# Patient Record
Sex: Female | Born: 1950
Health system: Southern US, Community
[De-identification: ages and names within clinical notes are randomized; demographics above are authoritative.]

## PROBLEM LIST (undated history)

## (undated) ENCOUNTER — Emergency Department (HOSPITAL_COMMUNITY): Admission: EM | Payer: BC Managed Care – PPO | Source: Home / Self Care

## (undated) DIAGNOSIS — E7800 Pure hypercholesterolemia, unspecified: Secondary | ICD-10-CM

## (undated) DIAGNOSIS — K219 Gastro-esophageal reflux disease without esophagitis: Secondary | ICD-10-CM

## (undated) DIAGNOSIS — D649 Anemia, unspecified: Secondary | ICD-10-CM

## (undated) DIAGNOSIS — K222 Esophageal obstruction: Secondary | ICD-10-CM

## (undated) DIAGNOSIS — K5790 Diverticulosis of intestine, part unspecified, without perforation or abscess without bleeding: Secondary | ICD-10-CM

## (undated) DIAGNOSIS — C801 Malignant (primary) neoplasm, unspecified: Secondary | ICD-10-CM

## (undated) DIAGNOSIS — F419 Anxiety disorder, unspecified: Secondary | ICD-10-CM

## (undated) DIAGNOSIS — K5909 Other constipation: Secondary | ICD-10-CM

## (undated) HISTORY — PX: MOHS SURGERY: SUR867

## (undated) HISTORY — PX: BREAST EXCISIONAL BIOPSY: SUR124

## (undated) HISTORY — PX: COLONOSCOPY: SHX174

## (undated) HISTORY — PX: BACK SURGERY: SHX140

---

## 1998-08-01 ENCOUNTER — Emergency Department (HOSPITAL_COMMUNITY): Admission: EM | Admit: 1998-08-01 | Discharge: 1998-08-01 | Payer: Self-pay | Admitting: Emergency Medicine

## 2000-10-12 ENCOUNTER — Encounter: Payer: Self-pay | Admitting: Emergency Medicine

## 2000-10-12 ENCOUNTER — Emergency Department (HOSPITAL_COMMUNITY): Admission: EM | Admit: 2000-10-12 | Discharge: 2000-10-12 | Payer: Self-pay | Admitting: Emergency Medicine

## 2002-05-29 ENCOUNTER — Encounter: Admission: RE | Admit: 2002-05-29 | Discharge: 2002-05-29 | Payer: Self-pay | Admitting: Neurosurgery

## 2002-05-29 ENCOUNTER — Encounter: Payer: Self-pay | Admitting: Neurosurgery

## 2002-08-01 ENCOUNTER — Inpatient Hospital Stay (HOSPITAL_COMMUNITY): Admission: RE | Admit: 2002-08-01 | Discharge: 2002-08-07 | Payer: Self-pay | Admitting: Neurosurgery

## 2002-08-01 ENCOUNTER — Encounter: Payer: Self-pay | Admitting: Neurosurgery

## 2002-09-28 ENCOUNTER — Encounter: Admission: RE | Admit: 2002-09-28 | Discharge: 2002-11-08 | Payer: Self-pay | Admitting: Neurosurgery

## 2002-11-09 ENCOUNTER — Encounter: Admission: RE | Admit: 2002-11-09 | Discharge: 2002-12-20 | Payer: Self-pay | Admitting: Neurosurgery

## 2003-06-27 ENCOUNTER — Encounter: Admission: RE | Admit: 2003-06-27 | Discharge: 2003-09-25 | Payer: Self-pay | Admitting: Neurosurgery

## 2014-01-27 ENCOUNTER — Encounter (HOSPITAL_COMMUNITY): Payer: Self-pay | Admitting: Emergency Medicine

## 2014-01-27 ENCOUNTER — Emergency Department (HOSPITAL_COMMUNITY): Payer: BC Managed Care – PPO

## 2014-01-27 ENCOUNTER — Emergency Department (HOSPITAL_COMMUNITY)
Admission: EM | Admit: 2014-01-27 | Discharge: 2014-01-27 | Disposition: A | Discharge status: Home / Self Care | Payer: BC Managed Care – PPO | Attending: Emergency Medicine | Admitting: Emergency Medicine

## 2014-01-27 DIAGNOSIS — R58 Hemorrhage, not elsewhere classified: Secondary | ICD-10-CM

## 2014-01-27 DIAGNOSIS — N939 Abnormal uterine and vaginal bleeding, unspecified: Secondary | ICD-10-CM | POA: Insufficient documentation

## 2014-01-27 DIAGNOSIS — N926 Irregular menstruation, unspecified: Secondary | ICD-10-CM | POA: Insufficient documentation

## 2014-01-27 DIAGNOSIS — K644 Residual hemorrhoidal skin tags: Secondary | ICD-10-CM | POA: Insufficient documentation

## 2014-01-27 LAB — POC OCCULT BLOOD, ED: Fecal Occult Bld: NEGATIVE

## 2014-01-27 LAB — URINALYSIS, ROUTINE W REFLEX MICROSCOPIC
Bilirubin Urine: NEGATIVE
GLUCOSE, UA: NEGATIVE mg/dL
HGB URINE DIPSTICK: NEGATIVE
KETONES UR: 15 mg/dL — AB
Leukocytes, UA: NEGATIVE
Nitrite: NEGATIVE
Protein, ur: NEGATIVE mg/dL
Specific Gravity, Urine: 1.022 (ref 1.005–1.030)
UROBILINOGEN UA: 0.2 mg/dL (ref 0.0–1.0)
pH: 7 (ref 5.0–8.0)

## 2014-01-27 LAB — BASIC METABOLIC PANEL
BUN: 12 mg/dL (ref 6–23)
CALCIUM: 9.9 mg/dL (ref 8.4–10.5)
CO2: 24 mEq/L (ref 19–32)
Chloride: 98 mEq/L (ref 96–112)
Creatinine, Ser: 0.77 mg/dL (ref 0.50–1.10)
GFR calc non Af Amer: 88 mL/min — ABNORMAL LOW (ref >90)
GLUCOSE: 126 mg/dL — AB (ref 70–99)
Potassium: 4.4 mEq/L (ref 3.7–5.3)
SODIUM: 138 meq/L (ref 137–147)

## 2014-01-27 LAB — CBC WITH DIFFERENTIAL/PLATELET
Basophils Absolute: 0 10*3/uL (ref 0.0–0.1)
Basophils Relative: 1 % (ref 0–1)
Eosinophils Absolute: 0.1 10*3/uL (ref 0.0–0.7)
Eosinophils Relative: 2 % (ref 0–5)
HCT: 40.9 % (ref 36.0–46.0)
Hemoglobin: 14.1 g/dL (ref 12.0–15.0)
Lymphocytes Relative: 20 % (ref 12–46)
Lymphs Abs: 1.3 10*3/uL (ref 0.7–4.0)
MCH: 31.4 pg (ref 26.0–34.0)
MCHC: 34.5 g/dL (ref 30.0–36.0)
MCV: 91.1 fL (ref 78.0–100.0)
Monocytes Absolute: 0.8 10*3/uL (ref 0.1–1.0)
Monocytes Relative: 12 % (ref 3–12)
Neutro Abs: 4.3 10*3/uL (ref 1.7–7.7)
Neutrophils Relative %: 65 % (ref 43–77)
Platelets: 305 10*3/uL (ref 150–400)
RBC: 4.49 MIL/uL (ref 3.87–5.11)
RDW: 13.7 % (ref 11.5–15.5)
WBC: 6.6 10*3/uL (ref 4.0–10.5)

## 2014-01-27 LAB — WET PREP, GENITAL
Clue Cells Wet Prep HPF POC: NONE SEEN
Trich, Wet Prep: NONE SEEN
WBC WET PREP: NONE SEEN
Yeast Wet Prep HPF POC: NONE SEEN

## 2014-01-27 NOTE — ED Provider Notes (Signed)
CSN: [id number]     Arrival date & time 01/27/14  1321 History   First MD Initiated Contact with Patient 01/27/14 1332     Chief Complaint  Patient presents with  . Vaginal Bleeding     (Consider location/radiation/quality/duration/timing/severity/associated sxs/prior Treatment) HPI Patient is G2 P2 Ab0. She reports she had no significant female problems while she was menstruating. She states she went through menopause about 10 years ago. She did not have hormonal replacement therapy. She states she was placed on Effexor which helped greatly with her hot flashes. She states this morning when she went to the bathroom she noted she had blood in the crotch of her underwear. She thinks it is from her vagina. She does not have any abdominal pain, urged to have a BM, nausea or vomiting. She has not had a BM today. She states when she went to the bathroom to urinate there was no obvious blood in the toilet.  Patient reports her mother had endometriosis and she has a sister who had a hysterectomy. There is also family history of fibroids. She is not aware of any GYN cancers in the family.  PCP Dr America Brown GYN Dr Ree Edman  History reviewed. No pertinent past medical history. History reviewed. No pertinent past surgical history. History reviewed. No pertinent family history. History  Substance Use Topics  . Smoking status: Never Smoker   . Smokeless tobacco: Not on file  . Alcohol Use: Yes   Stay at home mother  OB History   Grav Para Term Preterm Abortions TAB SAB Ect Mult Living                 Review of Systems  All other systems reviewed and are negative.     Allergies  Ciprofloxacin and Doxycycline  Home Medications   Current Outpatient Rx  Name  Route  Sig  Dispense  Refill  . Cholecalciferol 1000 UNITS capsule   Oral   Take 1,000 Units by mouth every evening.         . Coenzyme Q10 (COQ10) 200 MG CAPS   Oral   Take 200 mg by mouth every evening.         Marland Kitchen EFFEXOR  XR 75 MG 24 hr capsule               . Multiple Vitamin (MULTIVITAMIN WITH MINERALS) TABS tablet   Oral   Take 1 tablet by mouth every evening.         . Multiple Vitamins-Minerals (OCUVITE ADULT 50+ PO)   Oral   Take 1 tablet by mouth every evening.         Vladimir Faster Glycol-Propyl Glycol (SYSTANE OP)   Ophthalmic   Apply 1 drop to eye every morning.         . Pseudoephedrine-Ibuprofen 30-200 MG TABS   Oral   Take 1 tablet by mouth once.          BP 169/98  Pulse 107  Temp(Src) 98.7 F (37.1 C) (Oral)  Resp 22  Ht 5' (1.524 m)  Wt 143 lb (64.864 kg)  BMI 27.93 kg/m2  SpO2 98%  Vital signs normal except tachycardia  Orthostatic vital signs are negative  Physical Exam  Nursing note and vitals reviewed. Constitutional: She is oriented to person, place, and time. She appears well-developed and well-nourished.  Non-toxic appearance. She does not appear ill. No distress.  HENT:  Head: Normocephalic and atraumatic.  Right Ear: External ear normal.  Left  Ear: External ear normal.  Nose: Nose normal. No mucosal edema or rhinorrhea.  Mouth/Throat: Oropharynx is clear and moist and mucous membranes are normal. No dental abscesses or uvula swelling.  Eyes: Conjunctivae and EOM are normal. Pupils are equal, round, and reactive to light.  Neck: Normal range of motion and full passive range of motion without pain. Neck supple.  Cardiovascular: Normal rate, regular rhythm and normal heart sounds.  Exam reveals no gallop and no friction rub.   No murmur heard. Pulmonary/Chest: Effort normal and breath sounds normal. No respiratory distress. She has no wheezes. She has no rhonchi. She has no rales. She exhibits no tenderness and no crepitus.  Abdominal: Soft. Normal appearance and bowel sounds are normal. She exhibits no distension. There is no tenderness. There is no rebound and no guarding.  Genitourinary:  Visualization of her groin shows no obvious bleeding. Patient  will have pelvic exam done  No blood was seen in her vaginal vault. She does have some mild erythema scattered around the cervix however there is no active bleeding. Patient has not had recent sexual contact. Her uterus feels normal size or smaller than normal without tenderness. Her adnexal region are nontender bilaterally. There's no obvious masses felt.  Rectal exam shows some hemorrhoidal tags. There are no active hemorrhoids. There is no bleeding seen in the paramedian. On rectal exam there were small amount of yellow stool on glove that was sent for testing.  Musculoskeletal: Normal range of motion. She exhibits no edema and no tenderness.  Moves all extremities well.   Neurological: She is alert and oriented to person, place, and time. She has normal strength. No cranial nerve deficit.  Skin: Skin is warm, dry and intact. No rash noted. No erythema. No pallor.  Psychiatric: She has a normal mood and affect. Her speech is normal and behavior is normal. Her mood appears not anxious.    ED Course  Procedures (including critical care time)  Pt is agreeable to getting a pelvic US tonight.   Patient turned over to Dr. Darl Householder at change of shift to get her pelvic ultrasound results. At this time the source of her bleeding is undetermined. There is no blood in her vaginal vault, her urine was not grossly bloody, there was no blood in her rectal exam. She does have some hemorrhoids but there was no active bleeding seen.  Labs Review Results for orders placed during the hospital encounter of 01/27/14  WET PREP, GENITAL      Result Value Ref Range   Yeast Wet Prep HPF POC NONE SEEN  NONE SEEN   Trich, Wet Prep NONE SEEN  NONE SEEN   Clue Cells Wet Prep HPF POC NONE SEEN  NONE SEEN   WBC, Wet Prep HPF POC NONE SEEN  NONE SEEN  CBC WITH DIFFERENTIAL      Result Value Ref Range   WBC 6.6  4.0 - 10.5 K/uL   RBC 4.49  3.87 - 5.11 MIL/uL   Hemoglobin 14.1  12.0 - 15.0 g/dL   HCT 40.9  36.0 - 46.0 %    MCV 91.1  78.0 - 100.0 fL   MCH 31.4  26.0 - 34.0 pg   MCHC 34.5  30.0 - 36.0 g/dL   RDW 13.7  11.5 - 15.5 %   Platelets 305  150 - 400 K/uL   Neutrophils Relative % 65  43 - 77 %   Neutro Abs 4.3  1.7 - 7.7 K/uL   Lymphocytes  Relative 20  12 - 46 %   Lymphs Abs 1.3  0.7 - 4.0 K/uL   Monocytes Relative 12  3 - 12 %   Monocytes Absolute 0.8  0.1 - 1.0 K/uL   Eosinophils Relative 2  0 - 5 %   Eosinophils Absolute 0.1  0.0 - 0.7 K/uL   Basophils Relative 1  0 - 1 %   Basophils Absolute 0.0  0.0 - 0.1 K/uL  BASIC METABOLIC PANEL      Result Value Ref Range   Sodium 138  137 - 147 mEq/L   Potassium 4.4  3.7 - 5.3 mEq/L   Chloride 98  96 - 112 mEq/L   CO2 24  19 - 32 mEq/L   Glucose, Bld 126 (*) 70 - 99 mg/dL   BUN 12  6 - 23 mg/dL   Creatinine, Ser 0.77  0.50 - 1.10 mg/dL   Calcium 9.9  8.4 - 10.5 mg/dL   GFR calc non Af Amer 88 (*) >90 mL/min   GFR calc Af Amer >90  >90 mL/min  URINALYSIS, ROUTINE W REFLEX MICROSCOPIC      Result Value Ref Range   Color, Urine YELLOW  YELLOW   APPearance CLEAR  CLEAR   Specific Gravity, Urine 1.022  1.005 - 1.030   pH 7.0  5.0 - 8.0   Glucose, UA NEGATIVE  NEGATIVE mg/dL   Hgb urine dipstick NEGATIVE  NEGATIVE   Bilirubin Urine NEGATIVE  NEGATIVE   Ketones, ur 15 (*) NEGATIVE mg/dL   Protein, ur NEGATIVE  NEGATIVE mg/dL   Urobilinogen, UA 0.2  0.0 - 1.0 mg/dL   Nitrite NEGATIVE  NEGATIVE   Leukocytes, UA NEGATIVE  NEGATIVE  POC OCCULT BLOOD, ED      Result Value Ref Range   Fecal Occult Bld NEGATIVE  NEGATIVE   Laboratory interpretation all normal    Imaging Review No results found.   EKG Interpretation None      MDM   Final diagnoses:  Bleeding    Disposition pending  Rolland Porter, MD, Abram Sander     Janice Norrie, MD 01/27/14 [phone]

## 2014-01-27 NOTE — ED Provider Notes (Signed)
Physical Exam  BP 147/84  Pulse 84  Temp(Src) 98.7 F (37.1 C) (Oral)  Resp 22  Ht 5' (1.524 m)  Wt 143 lb (64.864 kg)  BMI 27.93 kg/m2  SpO2 97%  Physical Exam  ED Course  Procedures  Care assumed at sign out from Dr. Tomi Bamberger. Patient has post menopausal bleeding. Pending [place]. US showed fibroids and nl endometrial stripe. She has GYN f/u. I told her that she is likely still having some bleeding. Should see GYN for further workup.   Results for orders placed during the hospital encounter of 01/27/14  WET PREP, GENITAL      Result Value Ref Range   Yeast Wet Prep HPF POC NONE SEEN  NONE SEEN   Trich, Wet Prep NONE SEEN  NONE SEEN   Clue Cells Wet Prep HPF POC NONE SEEN  NONE SEEN   WBC, Wet Prep HPF POC NONE SEEN  NONE SEEN  CBC WITH DIFFERENTIAL      Result Value Ref Range   WBC 6.6  4.0 - 10.5 K/uL   RBC 4.49  3.87 - 5.11 MIL/uL   Hemoglobin 14.1  12.0 - 15.0 g/dL   HCT 40.9  36.0 - 46.0 %   MCV 91.1  78.0 - 100.0 fL   MCH 31.4  26.0 - 34.0 pg   MCHC 34.5  30.0 - 36.0 g/dL   RDW 13.7  11.5 - 15.5 %   Platelets 305  150 - 400 K/uL   Neutrophils Relative % 65  43 - 77 %   Neutro Abs 4.3  1.7 - 7.7 K/uL   Lymphocytes Relative 20  12 - 46 %   Lymphs Abs 1.3  0.7 - 4.0 K/uL   Monocytes Relative 12  3 - 12 %   Monocytes Absolute 0.8  0.1 - 1.0 K/uL   Eosinophils Relative 2  0 - 5 %   Eosinophils Absolute 0.1  0.0 - 0.7 K/uL   Basophils Relative 1  0 - 1 %   Basophils Absolute 0.0  0.0 - 0.1 K/uL  BASIC METABOLIC PANEL      Result Value Ref Range   Sodium 138  137 - 147 mEq/L   Potassium 4.4  3.7 - 5.3 mEq/L   Chloride 98  96 - 112 mEq/L   CO2 24  19 - 32 mEq/L   Glucose, Bld 126 (*) 70 - 99 mg/dL   BUN 12  6 - 23 mg/dL   Creatinine, Ser 0.77  0.50 - 1.10 mg/dL   Calcium 9.9  8.4 - 10.5 mg/dL   GFR calc non Af Amer 88 (*) >90 mL/min   GFR calc Af Amer >90  >90 mL/min  URINALYSIS, ROUTINE W REFLEX MICROSCOPIC      Result Value Ref Range   Color, Urine YELLOW   YELLOW   APPearance CLEAR  CLEAR   Specific Gravity, Urine 1.022  1.005 - 1.030   pH 7.0  5.0 - 8.0   Glucose, UA NEGATIVE  NEGATIVE mg/dL   Hgb urine dipstick NEGATIVE  NEGATIVE   Bilirubin Urine NEGATIVE  NEGATIVE   Ketones, ur 15 (*) NEGATIVE mg/dL   Protein, ur NEGATIVE  NEGATIVE mg/dL   Urobilinogen, UA 0.2  0.0 - 1.0 mg/dL   Nitrite NEGATIVE  NEGATIVE   Leukocytes, UA NEGATIVE  NEGATIVE  POC OCCULT BLOOD, ED      Result Value Ref Range   Fecal Occult Bld NEGATIVE  NEGATIVE   US Transvaginal Non-ob  01/27/2014   CLINICAL DATA:  Postmenopausal bleeding  EXAM: TRANSABDOMINAL AND TRANSVAGINAL ULTRASOUND OF PELVIS  TECHNIQUE: Both transabdominal and transvaginal ultrasound examinations of the pelvis were performed. Transabdominal technique was performed for global imaging of the pelvis including uterus, ovaries, adnexal regions, and pelvic cul-de-sac. It was necessary to proceed with endovaginal exam following the transabdominal exam to visualize the endometrium.  COMPARISON:  None  FINDINGS: Uterus  Measurements: 5.7 x 4.2 x 3.1 cm. There are multiple fibroids present. Two lie within the fundus superiorly and one lies posteriorly. The posterior fibroid is largest measuring 1.9 cm in greatest dimension. It is pedunculated.  Endometrium  Thickness: 2 mm.  There is fluid within the endometrial cavity.  Right ovary  Measurements: 1.6 x 1.0 x 1.1 cm. Normal appearance/no adnexal mass.  Left ovary  The left ovary could not be demonstrated.  Other findings  There is no free fluid within the cul de sac. The are echogenic foci within the cervix which may reflect tiny calcifications.  IMPRESSION: 1. The endometrial stripe measures 2 mm in thickness and there is fluid in the endometrial cavity. In the setting of post-menopausal bleeding, this is consistent with a benign etiology such as endometrial atrophy. If bleeding remains unresponsive to hormonal or medical therapy, sonohysterogram should be considered  for focal lesion work-up. (Ref: Radiological Reasoning: Algorithmic Workup of Abnormal Vaginal Bleeding with Endovaginal Sonography and Sonohysterography. AJR 2008; 818:E99-37) 2. There are multiple uterine fibroids present. 3. The left ovary could not be demonstrated. The right ovary is normal in appearance.   Electronically Signed   By: Rosamund Nyland  [place]   On: 01/27/2014 16:26   US Pelvis Complete  01/27/2014   CLINICAL DATA:  Postmenopausal bleeding  EXAM: TRANSABDOMINAL AND TRANSVAGINAL ULTRASOUND OF PELVIS  TECHNIQUE: Both transabdominal and transvaginal ultrasound examinations of the pelvis were performed. Transabdominal technique was performed for global imaging of the pelvis including uterus, ovaries, adnexal regions, and pelvic cul-de-sac. It was necessary to proceed with endovaginal exam following the transabdominal exam to visualize the endometrium.  COMPARISON:  None  FINDINGS: Uterus  Measurements: 5.7 x 4.2 x 3.1 cm. There are multiple fibroids present. Two lie within the fundus superiorly and one lies posteriorly. The posterior fibroid is largest measuring 1.9 cm in greatest dimension. It is pedunculated.  Endometrium  Thickness: 2 mm.  There is fluid within the endometrial cavity.  Right ovary  Measurements: 1.6 x 1.0 x 1.1 cm. Normal appearance/no adnexal mass.  Left ovary  The left ovary could not be demonstrated.  Other findings  There is no free fluid within the cul de sac. The are echogenic foci within the cervix which may reflect tiny calcifications.  IMPRESSION: 1. The endometrial stripe measures 2 mm in thickness and there is fluid in the endometrial cavity. In the setting of post-menopausal bleeding, this is consistent with a benign etiology such as endometrial atrophy. If bleeding remains unresponsive to hormonal or medical therapy, sonohysterogram should be considered for focal lesion work-up. (Ref: Radiological Reasoning: Algorithmic Workup of Abnormal Vaginal Bleeding with Endovaginal  Sonography and Sonohysterography. AJR 2008; 169:C78-93) 2. There are multiple uterine fibroids present. 3. The left ovary could not be demonstrated. The right ovary is normal in appearance.   Electronically Signed   By: Quirino Kakos  [place]   On: 01/27/2014 16:26        Wandra Arthurs, MD 01/27/14 1725

## 2014-01-27 NOTE — ED Notes (Signed)
Pt reports waking up this am and noticed moderate vaginal bleeding. Reports that she hasnt had a period in over 10 years. Denies any pain.

## 2014-01-27 NOTE — ED Notes (Addendum)
Pt c/o vaginal bleeding starting around noon today when she woke up.  Pt states it was a moderate amount, soaking through underwear to her pants.  Pt states when she found it at noon, it was dry and bleeding probably occurred overnight.  Pt reports not further bleeding has occurred.  Denies pain or cramping.  Reports some lightheadedness. Denies hx of any GYN problems. States she went through menopause approximately 10 years ago.  Pt states she's had a cold for the past few days but not concerned about those symptoms.  No acute distress, respirations equal and unlabored, skin warm and dry.

## 2014-01-27 NOTE — Discharge Instructions (Signed)
See your GYN doctor.   You may still have some bleeding.   Return to ER if you have severe bleeding, worse pain, passing out.

## 2014-01-29 LAB — GC/CHLAMYDIA PROBE AMP
CT PROBE, AMP APTIMA: NEGATIVE
GC Probe RNA: NEGATIVE

## 2015-07-24 ENCOUNTER — Other Ambulatory Visit: Payer: Self-pay | Admitting: Internal Medicine

## 2015-07-24 DIAGNOSIS — Z1231 Encounter for screening mammogram for malignant neoplasm of breast: Secondary | ICD-10-CM

## 2015-08-24 ENCOUNTER — Emergency Department (HOSPITAL_COMMUNITY)
Admission: EM | Admit: 2015-08-24 | Discharge: 2015-08-24 | Disposition: A | Discharge status: Home / Self Care | Payer: BLUE CROSS/BLUE SHIELD | Attending: Emergency Medicine | Admitting: Emergency Medicine

## 2015-08-24 ENCOUNTER — Encounter (HOSPITAL_COMMUNITY): Payer: Self-pay

## 2015-08-24 DIAGNOSIS — Y9389 Activity, other specified: Secondary | ICD-10-CM | POA: Diagnosis not present

## 2015-08-24 DIAGNOSIS — J3489 Other specified disorders of nose and nasal sinuses: Secondary | ICD-10-CM | POA: Diagnosis not present

## 2015-08-24 DIAGNOSIS — Y998 Other external cause status: Secondary | ICD-10-CM | POA: Insufficient documentation

## 2015-08-24 DIAGNOSIS — Z79899 Other long term (current) drug therapy: Secondary | ICD-10-CM | POA: Diagnosis not present

## 2015-08-24 DIAGNOSIS — W57XXXA Bitten or stung by nonvenomous insect and other nonvenomous arthropods, initial encounter: Secondary | ICD-10-CM | POA: Diagnosis not present

## 2015-08-24 DIAGNOSIS — Y9289 Other specified places as the place of occurrence of the external cause: Secondary | ICD-10-CM | POA: Insufficient documentation

## 2015-08-24 DIAGNOSIS — S30861A Insect bite (nonvenomous) of abdominal wall, initial encounter: Secondary | ICD-10-CM | POA: Insufficient documentation

## 2015-08-24 NOTE — ED Provider Notes (Signed)
History  By signing my name below, I, Marlowe Kays, attest that this documentation has been prepared under the direction and in the presence of Bernerd Limbo, [place]. Electronically Signed: Marlowe Kays, ED Scribe. 08/24/2015. 1:59 PM  Chief Complaint  Patient presents with  . Tick Removal    The history is provided by the patient and medical records. No language interpreter was used.     HPI Comments:  Claudia Robinson is a 64 y.o. female with no pertinent PMH who presents to the Emergency Department for tick removal. She states she has had a lesion to her abdomen x 3-4 days, which she initially though was a skin tag, until today, when she realized it was a tick. She reports she recently started taking augmentin on 10/27 for a sinus infection and developed a rash on her torso 3-4 days later. She contacted her PCP and was switched to azithromycin, which she states she is currently taking as directed. She denies exacerbating or alleviating factors. She denies fever, chills, headache, lightheadedness, dizziness, vision changes, arthralgia, myalgia, chest pain, shortness of breath, abdominal pain, N/V/D.     History reviewed. No pertinent past medical history. No past surgical history on file. No family history on file. Social History  Substance Use Topics  . Smoking status: Never Smoker   . Smokeless tobacco: None  . Alcohol Use: Yes   OB History    No data available       Review of Systems  Constitutional: Negative for fever, chills and fatigue.  HENT: Positive for sinus pressure.   Eyes: Negative for visual disturbance.  Respiratory: Negative for shortness of breath.   Cardiovascular: Negative for chest pain.  Gastrointestinal: Negative for nausea, vomiting, abdominal pain and diarrhea.  Musculoskeletal: Negative for myalgias, arthralgias, neck pain and neck stiffness.  Skin: Positive for rash and wound.  Neurological: Negative for dizziness, syncope, weakness,  light-headedness, numbness and headaches.  All other systems reviewed and are negative.   Allergies  Ciprofloxacin and Doxycycline  Home Medications   Prior to Admission medications   Medication Sig Start Date End Date Taking? Authorizing Provider  Cholecalciferol 1000 UNITS capsule Take 1,000 Units by mouth every evening.    Historical Provider, MD  Coenzyme Q10 (COQ10) 200 MG CAPS Take 200 mg by mouth every evening.    Historical Provider, MD  EFFEXOR XR 75 MG 24 hr capsule  01/16/14   Historical Provider, MD  famotidine (PEPCID) 20 MG tablet Take 20 mg by mouth 2 (two) times daily.    Historical Provider, MD  Multiple Vitamin (MULTIVITAMIN WITH MINERALS) TABS tablet Take 1 tablet by mouth every evening.    Historical Provider, MD  Multiple Vitamins-Minerals (OCUVITE ADULT 50+ PO) Take 1 tablet by mouth every evening.    Historical Provider, MD  Polyethyl Glycol-Propyl Glycol (SYSTANE OP) Apply 1 drop to eye every morning.    Historical Provider, MD  Pseudoephedrine-Ibuprofen 30-200 MG TABS Take 1 tablet by mouth once.    Historical Provider, MD    Triage Vitals: BP 112/77 mmHg  Pulse 76  Temp(Src) 97.9 F (36.6 C) (Oral)  Resp 16  SpO2 100% Physical Exam  Constitutional: She is oriented to person, place, and time. She appears well-developed and well-nourished. No distress.  HENT:  Head: Normocephalic and atraumatic.  Right Ear: External ear normal.  Left Ear: External ear normal.  Nose: Nose normal.  Mouth/Throat: Uvula is midline, oropharynx is clear and moist and mucous membranes are normal.  Eyes: Conjunctivae, EOM  and lids are normal. Pupils are equal, round, and reactive to light. Right eye exhibits no discharge. Left eye exhibits no discharge. No scleral icterus.  Neck: Normal range of motion. Neck supple.  Cardiovascular: Normal rate, regular rhythm, normal heart sounds, intact distal pulses and normal pulses.   Pulmonary/Chest: Effort normal and breath sounds normal.  No respiratory distress.  Abdominal: Soft. Normal appearance and bowel sounds are normal. She exhibits no distension and no mass. There is no tenderness. There is no rigidity, no rebound and no guarding.  Musculoskeletal: Normal range of motion. She exhibits no edema or tenderness.  Neurological: She is alert and oriented to person, place, and time. She has normal strength. No cranial nerve deficit or sensory deficit.  Skin: Skin is warm, dry and intact. Rash noted. She is not diaphoretic. No erythema. No pallor.  Tick to right upper quadrant of abdomen with small area of surrounding erythema. Diffuse, erythematous, wheal like rash to torso. No skin sloughing. No signs of infection.  Psychiatric: She has a normal mood and affect. Her speech is normal and behavior is normal. Judgment and thought content normal.  Nursing note and vitals reviewed.   ED Course  Procedures (including critical care time)  DIAGNOSTIC STUDIES: Oxygen Saturation is 100% on RA, normal by my interpretation.   COORDINATION OF CARE: 1:21 PM- Will speak with Dr. Alvino Chapel about appropriate course of action. Patient verbalizes understanding and agrees to plan.  Medications - No data to display   MDM   Final diagnoses:  Tick bite of abdomen, initial encounter    64 year old female presents for tick removal. States she thinks the tick has been present for 3-4 days. Also reports rash, which she attributes to reaction to augmentin (started 3-4 days after taking augmentin). Denies fever, chills, headache, lightheadedness, dizziness, vision changes, arthralgia, myalgia, chest pain, shortness of breath, abdominal pain, N/V/D.    Patient is afebrile. Vital signs stable. Heart RRR. Lungs clear to auscultation bilaterally. Abdomen soft, non-tender, non-distended. Tick to right upper quadrant of abdomen with small area of surrounding erythema. Diffuse, erythematous, wheal like rash to torso. No skin sloughing. No signs of  infection. Patient moves all extremities and ambulates without difficult. Normal neuro exam with no focal deficit.   Tick removed, site cleaned, which the patient tolerated well. Rash not consistent with erythema migrans, likely due to drug reaction. No evidence of infection. Patient is allergic to doxycyline. Advised to continue azithromycin for sinusitis, no further prophylaxis indicated at this time. Patient to follow-up with PCP this week. Return precautions discussed at length. Patient verbalizes her understanding and is in agreement with plan.   BP 112/77 mmHg  Pulse 77  Temp(Src) 97.9 F (36.6 C) (Oral)  Resp 16  SpO2 100%  I personally performed the services described in this documentation, which was scribed in my presence. The recorded information has been reviewed and is accurate.    Marella Chimes, PA-C 08/24/15 [place], MD 08/25/15 [phone]

## 2015-08-24 NOTE — ED Notes (Signed)
She has an imbedded tick on left stomach.  It is surrounded by ~0.5cm of erythema.  She states she is currently on azithromycin for "sinus infection".

## 2015-08-24 NOTE — Discharge Instructions (Signed)
1. Medications: usual home medications 2. Treatment: rest, drink plenty of fluids, keep dressing clean and dry, change dressing daily 3. Follow Up: please followup with your primary doctor in 2-3 days for discussion of your diagnoses and further evaluation after today's visit; if you do not have a primary care doctor use the resource guide provided to find one; please return to the ER for high fever, severe pain, signs of infection (redness, swelling, warmth), new or worsening symptoms   Tick Bite Information Ticks are insects that attach themselves to the skin and draw blood for food. There are various types of ticks. Common types include wood ticks and deer ticks. Most ticks live in shrubs and grassy areas. Ticks can climb onto your body when you make contact with leaves or grass where the tick is waiting. The most common places on the body for ticks to attach themselves are the scalp, neck, armpits, waist, and groin. Most tick bites are harmless, but sometimes ticks carry germs that cause diseases. These germs can be spread to a person during the tick's feeding process. The chance of a disease spreading through a tick bite depends on:   The type of tick.  Time of year.   How long the tick is attached.   Geographic location.  HOW CAN YOU PREVENT TICK BITES? Take these steps to help prevent tick bites when you are outdoors:  Wear protective clothing. Long sleeves and long pants are best.   Wear white clothes so you can see ticks more easily.  Tuck your pant legs into your socks.   If walking on a trail, stay in the middle of the trail to avoid brushing against bushes.  Avoid walking through areas with long grass.  Put insect repellent on all exposed skin and along boot tops, pant legs, and sleeve cuffs.   Check clothing, hair, and skin repeatedly and before going inside.   Brush off any ticks that are not attached.  Take a shower or bath as soon as possible after being  outdoors.  WHAT IS THE PROPER WAY TO REMOVE A TICK? Ticks should be removed as soon as possible to help prevent diseases caused by tick bites. 1. If latex gloves are available, put them on before trying to remove a tick.  2. Using fine-point tweezers, grasp the tick as close to the skin as possible. You may also use curved forceps or a tick removal tool. Grasp the tick as close to its head as possible. Avoid grasping the tick on its body. 3. Pull gently with steady upward pressure until the tick lets go. Do not twist the tick or jerk it suddenly. This may break off the tick's head or mouth parts. 4. Do not squeeze or crush the tick's body. This could force disease-carrying fluids from the tick into your body.  5. After the tick is removed, wash the bite area and your hands with soap and water or other disinfectant such as alcohol. 6. Apply a small amount of antiseptic cream or ointment to the bite site.  7. Wash and disinfect any instruments that were used.  Do not try to remove a tick by applying a hot match, petroleum jelly, or fingernail polish to the tick. These methods do not work and may increase the chances of disease being spread from the tick bite.  WHEN SHOULD YOU SEEK MEDICAL CARE? Contact your health care provider if you are unable to remove a tick from your skin or if a part of  the tick breaks off and is stuck in the skin.  After a tick bite, you need to be aware of signs and symptoms that could be related to diseases spread by ticks. Contact your health care provider if you develop any of the following in the days or weeks after the tick bite:  Unexplained fever.  Rash. A circular rash that appears days or weeks after the tick bite may indicate the possibility of Lyme disease. The rash may resemble a target with a bull's-eye and may occur at a different part of your body than the tick bite.  Redness and swelling in the area of the tick bite.   Tender, swollen lymph glands.    Diarrhea.   Weight loss.   Cough.   Fatigue.   Muscle, joint, or bone pain.   Abdominal pain.   Headache.   Lethargy or a change in your level of consciousness.  Difficulty walking or moving your legs.   Numbness in the legs.   Paralysis.  Shortness of breath.   Confusion.   Repeated vomiting.    This information is not intended to replace advice given to you by your health care provider. Make sure you discuss any questions you have with your health care provider.   Document Released: 10/02/2000 Document Revised: 10/26/2014 Document Reviewed: 03/15/2013 Elsevier Interactive Patient Education 2016 [employer].   Emergency Department Resource Guide 1) Find a Doctor and Pay Out of Pocket Although you won't have to find out who is covered by your insurance plan, it is a good idea to ask around and get recommendations. You will then need to call the office and see if the doctor you have chosen will accept you as a new patient and what types of options they offer for patients who are self-pay. Some doctors offer discounts or will set up payment plans for their patients who do not have insurance, but you will need to ask so you aren't surprised when you get to your appointment.  2) Contact Your Local Health Department Not all health departments have doctors that can see patients for sick visits, but many do, so it is worth a call to see if yours does. If you don't know where your local health department is, you can check in your phone book. The CDC also has a tool to help you locate your state's health department, and many state websites also have listings of all of their local health departments.  3) Find a [facility] If your illness is not likely to be very severe or complicated, you may want to try a walk in clinic. These are popping up all over the country in pharmacies, drugstores, and shopping centers. They're usually staffed by nurse practitioners or  physician assistants that have been trained to treat common illnesses and complaints. They're usually fairly quick and inexpensive. However, if you have serious medical issues or chronic medical problems, these are probably not your best option.  No Primary Care Doctor: - Call Health Connect at  [phone] - they can help you locate a primary care doctor that  accepts your insurance, provides certain services, etc. - Physician Referral Service- [phone]  Chronic Pain Problems: Organization         Address  Phone   Notes  [facility]  [phone] Patients need to be referred by their primary care doctor.   Medication Assistance: Organization         Address  Phone   Notes  [facility] Medication Assistance Program [address] [place], [place] [address] [phone] --Must be a resident of [facility] -- Must have NO insurance coverage whatsoever (no Medicaid/ Medicare, etc.) -- The pt. MUST have a primary care doctor that directs their care regularly and follows them in the community   Med[facility]ist  [phone]   [employer]  2812643203    Agencies that provide inexpensive medical care: Organization         Address  Phone   Notes  [place]  [phone]   [facility]    [phone]   [facility] [place], [place] [address] [phone]   [place] [address] [place] [phone]   Planned Parenthood    [phone]   [facility]    [phone]   [place] and [place] [address], [place] Phone:  [phone], Fax:  [phone] Hours of Operation:  9 am - 6 pm, M-F.  Also accepts Medicaid/Medicare and self-pay.  [facility] [place] for [place] [address], [address], [place] Phone: [phone], Fax: [phone]. Hours of Operation:  8:30 am - 5:30 pm, M-F.   Also accepts Medicaid and self-pay.  [facility] [place] [address] [place] Phone: [phone]   [place], [place], [place] [phone], Ext. [phone] Mondays & Thursdays: 7-9 AM.  First 15 patients are seen on a first come, first serve basis.    [place] Providers:  Organization         Address  Phone   Notes  [facility] [address], [place] [phone] Also accepts self-pay patients.  [facility] [address], [place]  [phone]   [place], [address], [place] [phone]   [facility] [address] [place] [phone]   Lucianne Lei [address], [place]   [phone] Only accepts [place] Access [place] patients after they have their name applied to their card.   Self-Pay (no insurance) in [facility]:  Organization         Address  Phone   Notes  Sickle Cell Patients, [facility] [place] [phone]   [facility] [place] [phone]   [facility] [place]  [place], [place], [place] [phone]   [facility]/Dr. Osei-Bonsu  [address], [place] or [address], [place] [phone] Phone number for both [place] and [place] locations is the same.  Urgent Medical and [facility] [address], [place] [phone]   [facility] [address] [place] or [address] [phone]   [facility] [address] [place] [phone], phone; [phone], fax Sees patients 1st and 3rd Saturday of every month.  Must not qualify for public or private insurance (i.e. Medicaid, Medicare, [place] Health Choice, Veterans'  Benefits)  Household income should be no more than 200% of the poverty level The clinic cannot treat you if you are pregnant or think you are pregnant  Sexually transmitted diseases are  not treated at the clinic.    Dental Care: Organization         Address  Phone  Notes  [facility] [place] [phone] Accepts children up to age 52 who are enrolled in [place] or [place]; pregnant women with a Medicaid card; and children who have applied for Medicaid or [place] Health Choice, but were declined, whose parents can pay a reduced fee at time of service.  [facility] of [facility]  [address], [place] [phone] Accepts children up to age 39 who are enrolled in [place] or [place]; pregnant women with a Medicaid card; and children who have applied for Medicaid or [place] Health Choice, but were declined, whose parents can pay a reduced fee at time of service.  [place] Adult Dental Access PROGRAM  [place] [phone] Patients are seen by appointment only. Walk-ins are not accepted. [place] will see patients 23 years of age and older. Monday - Tuesday (8am-5pm) Most Wednesdays (8:30-5pm) $30 per visit, cash only  [facility] Adult Dental Access PROGRAM  [address], [facility] [phone] Patients are seen by appointment only. Walk-ins are not accepted. [place] will see patients 56 years of age and older. One Wednesday Evening (Monthly: Volunteer Based).  $30 per visit, cash only  [place]  [phone] for adults; Children under age 92, call Graduate Pediatric Dentistry at [phone]. Children aged 52-14, please call [phone] to request a pediatric application.  Dental services are provided in all areas of dental care including fillings, crowns and bridges, complete and partial  dentures, implants, gum treatment, root canals, and extractions. Preventive care is also provided. Treatment is provided to both adults and children. Patients are selected via a lottery and there is often a waiting list.   [facility] [address], [place]  [phone] [email or web address]   [facility] [address] [place], [place] [phone], Ext. [phone] Second and Fourth Thursday of each month, opens at 6:30 AM; Clinic ends at 9 AM.  Patients are seen on a first-come first-served basis, and a limited number are seen during each clinic.   [facility]  [address] Hillard Danker [place], [place] [phone]   Eligibility Requirements You must have lived in [place], [place], or [place] counties for at least the last three months.   You cannot be eligible for state or federal sponsored [employer], including [employer], [place], or [employer].   You generally cannot be eligible for healthcare insurance through your employer.    How to apply: Eligibility screenings are held every Tuesday and Wednesday afternoon from 1:00 pm until 4:00 pm. You do not need an appointment for the interview!  [facility] [address] [place], [place]   [place]  [place] Department  [place]  [phone]    Behavioral Health Resources in the Community: Intensive Outpatient Programs Organization         Address  Phone  Notes  [place]. [address], [place], [place] [phone]   [facility] Outpatient [address], [place], [place]   ADS: Alcohol [facility] Drug S[facility] [address] [place], [place]   [place]  [place] [address],  [place], [place] or [phone]   Substance Abuse Resources Organization          Address  Phone  Notes  Alcohol and Drug Services  [phone]   [place]  [phone]   The [place]   Chinita Pester  [phone]   Residential & Outpatient Substance Abuse Program  [phone]   Psychological Services Organization         Address  Phone  Notes  [facility] [place]  [place]  [phone]   [place] [address], [place] or [phone]    Mobile Crisis Teams Organization         Address  Phone  Notes  [facility], Mobile Crisis Care Unit  [phone]   [facility]  [address] [place], [place]   [place] Levels [address], [place] [phone]    Self-Help/Support Groups Organization         Address  Phone             Notes  [place]. of [place] - variety of support groups  [place] Call for more information  Narcotics Anonymous (NA), [facility] [address], [employer] [place]  2 meetings at this location   Special educational needs teacher         Address  Phone  Notes  [facility] [place],    [place]  [phone]   [facility]  [address] [place] [address], [place], [place]   [place], [place] [phone] Admissions: 8am-3pm M-F  Incentives Substance [place] [address],    [place], [place] [phone]   [facility] [address] [place], [place], [place]   [facility] [address]  [place], [place]   [facility] - Intensive Outpatient [address] Kristeen [address], [place], [place]   [facility] ([place].,  [place], [place] [phone] or [phone]   [facility] (RTS) [address] [place], [employer] Accepts Medicaid  [facility] [place] [address]  [place] [phone] Substance Abuse/Addiction Treatment   [facility] Organization         Address  Phone  Notes  [facility]  [phone]   Domenic Schwab, PhD [address] [place]lis [place], [place]   [phone] or [phone]   [facility] [place], [place] [phone]   [facility] [address] [place], [place] [phone] Insurance/Medicaid/sponsorship through [facility] and Families [address]                                    [place], [place] [phone] [place] [address], [place] [phone]    Dr. Adele Schilder  [phone]   [facility] [place] Dept. 1) [address], [place] 2) [place] 3)  [address], [place] [phone]  [phone]   [place] [phone] or [phone] (After Hours)

## 2015-09-04 ENCOUNTER — Ambulatory Visit
Admission: RE | Admit: 2015-09-04 | Discharge: 2015-09-04 | Disposition: A | Discharge status: Home / Self Care | Payer: BLUE CROSS/BLUE SHIELD | Source: Ambulatory Visit | Attending: Internal Medicine | Admitting: Internal Medicine

## 2015-09-04 DIAGNOSIS — Z1231 Encounter for screening mammogram for malignant neoplasm of breast: Secondary | ICD-10-CM

## 2015-09-11 ENCOUNTER — Other Ambulatory Visit: Payer: Self-pay | Admitting: Internal Medicine

## 2015-09-11 DIAGNOSIS — R928 Other abnormal and inconclusive findings on diagnostic imaging of breast: Secondary | ICD-10-CM

## 2015-09-23 ENCOUNTER — Ambulatory Visit
Admission: RE | Admit: 2015-09-23 | Discharge: 2015-09-23 | Disposition: A | Discharge status: Home / Self Care | Payer: BLUE CROSS/BLUE SHIELD | Source: Ambulatory Visit | Attending: Internal Medicine | Admitting: Internal Medicine

## 2015-09-23 DIAGNOSIS — R928 Other abnormal and inconclusive findings on diagnostic imaging of breast: Secondary | ICD-10-CM

## 2016-01-20 DIAGNOSIS — F4323 Adjustment disorder with mixed anxiety and depressed mood: Secondary | ICD-10-CM | POA: Diagnosis not present

## 2016-01-29 DIAGNOSIS — F4323 Adjustment disorder with mixed anxiety and depressed mood: Secondary | ICD-10-CM | POA: Diagnosis not present

## 2016-02-26 DIAGNOSIS — F4323 Adjustment disorder with mixed anxiety and depressed mood: Secondary | ICD-10-CM | POA: Diagnosis not present

## 2016-03-09 DIAGNOSIS — F4323 Adjustment disorder with mixed anxiety and depressed mood: Secondary | ICD-10-CM | POA: Diagnosis not present

## 2016-03-23 DIAGNOSIS — F4323 Adjustment disorder with mixed anxiety and depressed mood: Secondary | ICD-10-CM | POA: Diagnosis not present

## 2016-04-06 DIAGNOSIS — F4323 Adjustment disorder with mixed anxiety and depressed mood: Secondary | ICD-10-CM | POA: Diagnosis not present

## 2016-04-07 DIAGNOSIS — H353131 Nonexudative age-related macular degeneration, bilateral, early dry stage: Secondary | ICD-10-CM | POA: Diagnosis not present

## 2016-04-07 DIAGNOSIS — H02834 Dermatochalasis of left upper eyelid: Secondary | ICD-10-CM | POA: Diagnosis not present

## 2016-04-07 DIAGNOSIS — H02831 Dermatochalasis of right upper eyelid: Secondary | ICD-10-CM | POA: Diagnosis not present

## 2016-04-07 DIAGNOSIS — H2513 Age-related nuclear cataract, bilateral: Secondary | ICD-10-CM | POA: Diagnosis not present

## 2016-07-15 DIAGNOSIS — Z23 Encounter for immunization: Secondary | ICD-10-CM | POA: Diagnosis not present

## 2016-08-03 DIAGNOSIS — K5901 Slow transit constipation: Secondary | ICD-10-CM | POA: Diagnosis not present

## 2016-08-03 DIAGNOSIS — K921 Melena: Secondary | ICD-10-CM | POA: Diagnosis not present

## 2016-08-03 DIAGNOSIS — Z1211 Encounter for screening for malignant neoplasm of colon: Secondary | ICD-10-CM | POA: Diagnosis not present

## 2016-08-24 DIAGNOSIS — F4323 Adjustment disorder with mixed anxiety and depressed mood: Secondary | ICD-10-CM | POA: Diagnosis not present

## 2016-08-31 DIAGNOSIS — F4323 Adjustment disorder with mixed anxiety and depressed mood: Secondary | ICD-10-CM | POA: Diagnosis not present

## 2016-09-02 DIAGNOSIS — D126 Benign neoplasm of colon, unspecified: Secondary | ICD-10-CM | POA: Diagnosis not present

## 2016-09-02 DIAGNOSIS — K5730 Diverticulosis of large intestine without perforation or abscess without bleeding: Secondary | ICD-10-CM | POA: Diagnosis not present

## 2016-09-02 DIAGNOSIS — D123 Benign neoplasm of transverse colon: Secondary | ICD-10-CM | POA: Diagnosis not present

## 2016-09-02 DIAGNOSIS — K635 Polyp of colon: Secondary | ICD-10-CM | POA: Diagnosis not present

## 2016-09-02 DIAGNOSIS — D120 Benign neoplasm of cecum: Secondary | ICD-10-CM | POA: Diagnosis not present

## 2016-09-02 DIAGNOSIS — K644 Residual hemorrhoidal skin tags: Secondary | ICD-10-CM | POA: Diagnosis not present

## 2016-09-02 DIAGNOSIS — Z8371 Family history of colonic polyps: Secondary | ICD-10-CM | POA: Diagnosis not present

## 2016-09-02 DIAGNOSIS — K648 Other hemorrhoids: Secondary | ICD-10-CM | POA: Diagnosis not present

## 2016-09-02 DIAGNOSIS — Z1211 Encounter for screening for malignant neoplasm of colon: Secondary | ICD-10-CM | POA: Diagnosis not present

## 2016-09-08 DIAGNOSIS — D126 Benign neoplasm of colon, unspecified: Secondary | ICD-10-CM | POA: Diagnosis not present

## 2016-09-08 DIAGNOSIS — K635 Polyp of colon: Secondary | ICD-10-CM | POA: Diagnosis not present

## 2016-09-08 DIAGNOSIS — Z1211 Encounter for screening for malignant neoplasm of colon: Secondary | ICD-10-CM | POA: Diagnosis not present

## 2016-11-03 DIAGNOSIS — F419 Anxiety disorder, unspecified: Secondary | ICD-10-CM | POA: Diagnosis not present

## 2016-11-03 DIAGNOSIS — Z23 Encounter for immunization: Secondary | ICD-10-CM | POA: Diagnosis not present

## 2016-11-03 DIAGNOSIS — E7800 Pure hypercholesterolemia, unspecified: Secondary | ICD-10-CM | POA: Diagnosis not present

## 2016-11-03 DIAGNOSIS — Z853 Personal history of malignant neoplasm of breast: Secondary | ICD-10-CM | POA: Diagnosis not present

## 2016-11-03 DIAGNOSIS — E559 Vitamin D deficiency, unspecified: Secondary | ICD-10-CM | POA: Diagnosis not present

## 2016-11-03 DIAGNOSIS — Z0000 Encounter for general adult medical examination without abnormal findings: Secondary | ICD-10-CM | POA: Diagnosis not present

## 2016-11-06 DIAGNOSIS — Z85828 Personal history of other malignant neoplasm of skin: Secondary | ICD-10-CM | POA: Diagnosis not present

## 2016-11-06 DIAGNOSIS — L304 Erythema intertrigo: Secondary | ICD-10-CM | POA: Diagnosis not present

## 2016-11-06 DIAGNOSIS — L570 Actinic keratosis: Secondary | ICD-10-CM | POA: Diagnosis not present

## 2016-11-06 DIAGNOSIS — L821 Other seborrheic keratosis: Secondary | ICD-10-CM | POA: Diagnosis not present

## 2016-12-09 DIAGNOSIS — J329 Chronic sinusitis, unspecified: Secondary | ICD-10-CM | POA: Diagnosis not present

## 2017-01-26 DIAGNOSIS — F4323 Adjustment disorder with mixed anxiety and depressed mood: Secondary | ICD-10-CM | POA: Diagnosis not present

## 2017-02-23 DIAGNOSIS — F4323 Adjustment disorder with mixed anxiety and depressed mood: Secondary | ICD-10-CM | POA: Diagnosis not present

## 2017-04-15 DIAGNOSIS — L049 Acute lymphadenitis, unspecified: Secondary | ICD-10-CM | POA: Diagnosis not present

## 2017-05-19 DIAGNOSIS — F4323 Adjustment disorder with mixed anxiety and depressed mood: Secondary | ICD-10-CM | POA: Diagnosis not present

## 2017-05-26 DIAGNOSIS — F4323 Adjustment disorder with mixed anxiety and depressed mood: Secondary | ICD-10-CM | POA: Diagnosis not present

## 2017-07-16 DIAGNOSIS — F4323 Adjustment disorder with mixed anxiety and depressed mood: Secondary | ICD-10-CM | POA: Diagnosis not present

## 2017-07-16 DIAGNOSIS — Z23 Encounter for immunization: Secondary | ICD-10-CM | POA: Diagnosis not present

## 2017-07-28 DIAGNOSIS — F4323 Adjustment disorder with mixed anxiety and depressed mood: Secondary | ICD-10-CM | POA: Diagnosis not present

## 2017-08-02 DIAGNOSIS — F4323 Adjustment disorder with mixed anxiety and depressed mood: Secondary | ICD-10-CM | POA: Diagnosis not present

## 2017-08-04 DIAGNOSIS — F4323 Adjustment disorder with mixed anxiety and depressed mood: Secondary | ICD-10-CM | POA: Diagnosis not present

## 2017-08-11 DIAGNOSIS — F4323 Adjustment disorder with mixed anxiety and depressed mood: Secondary | ICD-10-CM | POA: Diagnosis not present

## 2017-08-16 DIAGNOSIS — F4323 Adjustment disorder with mixed anxiety and depressed mood: Secondary | ICD-10-CM | POA: Diagnosis not present

## 2017-11-08 DIAGNOSIS — S61211A Laceration without foreign body of left index finger without damage to nail, initial encounter: Secondary | ICD-10-CM | POA: Diagnosis not present

## 2018-02-22 DIAGNOSIS — R1311 Dysphagia, oral phase: Secondary | ICD-10-CM | POA: Diagnosis not present

## 2018-02-22 DIAGNOSIS — K210 Gastro-esophageal reflux disease with esophagitis: Secondary | ICD-10-CM | POA: Diagnosis not present

## 2018-02-22 DIAGNOSIS — K5901 Slow transit constipation: Secondary | ICD-10-CM | POA: Diagnosis not present

## 2018-03-23 DIAGNOSIS — K449 Diaphragmatic hernia without obstruction or gangrene: Secondary | ICD-10-CM | POA: Diagnosis not present

## 2018-03-23 DIAGNOSIS — R1310 Dysphagia, unspecified: Secondary | ICD-10-CM | POA: Diagnosis not present

## 2018-03-23 DIAGNOSIS — K222 Esophageal obstruction: Secondary | ICD-10-CM | POA: Diagnosis not present

## 2018-04-13 DIAGNOSIS — S90462A Insect bite (nonvenomous), left great toe, initial encounter: Secondary | ICD-10-CM | POA: Diagnosis not present

## 2018-04-13 DIAGNOSIS — L03032 Cellulitis of left toe: Secondary | ICD-10-CM | POA: Diagnosis not present

## 2018-04-13 DIAGNOSIS — W57XXXA Bitten or stung by nonvenomous insect and other nonvenomous arthropods, initial encounter: Secondary | ICD-10-CM | POA: Diagnosis not present

## 2018-04-19 DIAGNOSIS — L03032 Cellulitis of left toe: Secondary | ICD-10-CM | POA: Diagnosis not present

## 2018-04-19 DIAGNOSIS — I889 Nonspecific lymphadenitis, unspecified: Secondary | ICD-10-CM | POA: Diagnosis not present

## 2018-07-22 DIAGNOSIS — E559 Vitamin D deficiency, unspecified: Secondary | ICD-10-CM | POA: Diagnosis not present

## 2018-07-22 DIAGNOSIS — E7800 Pure hypercholesterolemia, unspecified: Secondary | ICD-10-CM | POA: Diagnosis not present

## 2018-07-22 DIAGNOSIS — Z0000 Encounter for general adult medical examination without abnormal findings: Secondary | ICD-10-CM | POA: Diagnosis not present

## 2018-07-25 DIAGNOSIS — Z0000 Encounter for general adult medical examination without abnormal findings: Secondary | ICD-10-CM | POA: Diagnosis not present

## 2018-07-25 DIAGNOSIS — K222 Esophageal obstruction: Secondary | ICD-10-CM | POA: Diagnosis not present

## 2018-07-25 DIAGNOSIS — E559 Vitamin D deficiency, unspecified: Secondary | ICD-10-CM | POA: Diagnosis not present

## 2018-07-25 DIAGNOSIS — E7800 Pure hypercholesterolemia, unspecified: Secondary | ICD-10-CM | POA: Diagnosis not present

## 2018-07-25 DIAGNOSIS — Z23 Encounter for immunization: Secondary | ICD-10-CM | POA: Diagnosis not present

## 2018-07-25 DIAGNOSIS — Z803 Family history of malignant neoplasm of breast: Secondary | ICD-10-CM | POA: Diagnosis not present

## 2018-08-20 DIAGNOSIS — S90862A Insect bite (nonvenomous), left foot, initial encounter: Secondary | ICD-10-CM | POA: Diagnosis not present

## 2018-08-24 DIAGNOSIS — M8588 Other specified disorders of bone density and structure, other site: Secondary | ICD-10-CM | POA: Diagnosis not present

## 2018-09-23 DIAGNOSIS — L821 Other seborrheic keratosis: Secondary | ICD-10-CM | POA: Diagnosis not present

## 2018-09-23 DIAGNOSIS — L570 Actinic keratosis: Secondary | ICD-10-CM | POA: Diagnosis not present

## 2018-09-23 DIAGNOSIS — L812 Freckles: Secondary | ICD-10-CM | POA: Diagnosis not present

## 2018-09-23 DIAGNOSIS — Z85828 Personal history of other malignant neoplasm of skin: Secondary | ICD-10-CM | POA: Diagnosis not present

## 2018-09-29 DIAGNOSIS — H2513 Age-related nuclear cataract, bilateral: Secondary | ICD-10-CM | POA: Diagnosis not present

## 2018-09-29 DIAGNOSIS — H02831 Dermatochalasis of right upper eyelid: Secondary | ICD-10-CM | POA: Diagnosis not present

## 2018-09-29 DIAGNOSIS — H02834 Dermatochalasis of left upper eyelid: Secondary | ICD-10-CM | POA: Diagnosis not present

## 2018-09-29 DIAGNOSIS — H353131 Nonexudative age-related macular degeneration, bilateral, early dry stage: Secondary | ICD-10-CM | POA: Diagnosis not present

## 2018-10-26 DIAGNOSIS — L83 Acanthosis nigricans: Secondary | ICD-10-CM | POA: Diagnosis not present

## 2018-10-26 DIAGNOSIS — D23122 Other benign neoplasm of skin of left lower eyelid, including canthus: Secondary | ICD-10-CM | POA: Diagnosis not present

## 2018-12-02 DIAGNOSIS — H43811 Vitreous degeneration, right eye: Secondary | ICD-10-CM | POA: Diagnosis not present

## 2018-12-02 DIAGNOSIS — H2513 Age-related nuclear cataract, bilateral: Secondary | ICD-10-CM | POA: Diagnosis not present

## 2018-12-02 DIAGNOSIS — H02831 Dermatochalasis of right upper eyelid: Secondary | ICD-10-CM | POA: Diagnosis not present

## 2018-12-02 DIAGNOSIS — H353131 Nonexudative age-related macular degeneration, bilateral, early dry stage: Secondary | ICD-10-CM | POA: Diagnosis not present

## 2019-01-30 DIAGNOSIS — L570 Actinic keratosis: Secondary | ICD-10-CM | POA: Diagnosis not present

## 2019-02-01 DIAGNOSIS — H43811 Vitreous degeneration, right eye: Secondary | ICD-10-CM | POA: Diagnosis not present

## 2019-02-15 DIAGNOSIS — K118 Other diseases of salivary glands: Secondary | ICD-10-CM | POA: Diagnosis not present

## 2019-02-15 DIAGNOSIS — Z7289 Other problems related to lifestyle: Secondary | ICD-10-CM | POA: Diagnosis not present

## 2019-02-20 ENCOUNTER — Other Ambulatory Visit: Payer: Self-pay | Admitting: Otolaryngology

## 2019-02-20 DIAGNOSIS — K118 Other diseases of salivary glands: Secondary | ICD-10-CM

## 2019-03-01 ENCOUNTER — Other Ambulatory Visit: Payer: Self-pay

## 2019-03-01 ENCOUNTER — Ambulatory Visit
Admission: RE | Admit: 2019-03-01 | Discharge: 2019-03-01 | Disposition: A | Discharge status: Home / Self Care | Payer: BLUE CROSS/BLUE SHIELD | Source: Ambulatory Visit | Attending: Otolaryngology | Admitting: Otolaryngology

## 2019-03-01 DIAGNOSIS — K118 Other diseases of salivary glands: Secondary | ICD-10-CM

## 2019-03-01 MED ORDER — IOPAMIDOL (ISOVUE-300) INJECTION 61%
75.0000 mL | Freq: Once | INTRAVENOUS | Status: AC | PRN
  Administered 2019-03-01: 12:00:00 75 mL via INTRAVENOUS

## 2019-03-03 DIAGNOSIS — K118 Other diseases of salivary glands: Secondary | ICD-10-CM | POA: Diagnosis not present

## 2019-03-03 DIAGNOSIS — E041 Nontoxic single thyroid nodule: Secondary | ICD-10-CM | POA: Diagnosis not present

## 2019-03-03 DIAGNOSIS — Z7289 Other problems related to lifestyle: Secondary | ICD-10-CM | POA: Diagnosis not present

## 2019-03-16 DIAGNOSIS — H43811 Vitreous degeneration, right eye: Secondary | ICD-10-CM | POA: Diagnosis not present

## 2019-03-16 DIAGNOSIS — H2513 Age-related nuclear cataract, bilateral: Secondary | ICD-10-CM | POA: Diagnosis not present

## 2019-05-01 DIAGNOSIS — K222 Esophageal obstruction: Secondary | ICD-10-CM | POA: Diagnosis not present

## 2019-05-01 DIAGNOSIS — R1311 Dysphagia, oral phase: Secondary | ICD-10-CM | POA: Diagnosis not present

## 2019-05-31 DIAGNOSIS — Z1159 Encounter for screening for other viral diseases: Secondary | ICD-10-CM | POA: Diagnosis not present

## 2019-06-05 DIAGNOSIS — K317 Polyp of stomach and duodenum: Secondary | ICD-10-CM | POA: Diagnosis not present

## 2019-06-05 DIAGNOSIS — R1310 Dysphagia, unspecified: Secondary | ICD-10-CM | POA: Diagnosis not present

## 2019-06-05 DIAGNOSIS — K449 Diaphragmatic hernia without obstruction or gangrene: Secondary | ICD-10-CM | POA: Diagnosis not present

## 2019-06-05 DIAGNOSIS — K222 Esophageal obstruction: Secondary | ICD-10-CM | POA: Diagnosis not present

## 2019-06-20 DIAGNOSIS — L309 Dermatitis, unspecified: Secondary | ICD-10-CM | POA: Diagnosis not present

## 2019-06-20 DIAGNOSIS — C44629 Squamous cell carcinoma of skin of left upper limb, including shoulder: Secondary | ICD-10-CM | POA: Diagnosis not present

## 2019-06-20 DIAGNOSIS — L603 Nail dystrophy: Secondary | ICD-10-CM | POA: Diagnosis not present

## 2019-06-20 DIAGNOSIS — Z85828 Personal history of other malignant neoplasm of skin: Secondary | ICD-10-CM | POA: Diagnosis not present

## 2019-06-29 DIAGNOSIS — C44629 Squamous cell carcinoma of skin of left upper limb, including shoulder: Secondary | ICD-10-CM | POA: Diagnosis not present

## 2019-07-20 DIAGNOSIS — Z23 Encounter for immunization: Secondary | ICD-10-CM | POA: Diagnosis not present

## 2019-08-22 DIAGNOSIS — K222 Esophageal obstruction: Secondary | ICD-10-CM | POA: Diagnosis not present

## 2019-08-22 DIAGNOSIS — K449 Diaphragmatic hernia without obstruction or gangrene: Secondary | ICD-10-CM | POA: Diagnosis not present

## 2019-08-22 DIAGNOSIS — D110 Benign neoplasm of parotid gland: Secondary | ICD-10-CM | POA: Diagnosis not present

## 2019-08-22 DIAGNOSIS — Z79899 Other long term (current) drug therapy: Secondary | ICD-10-CM | POA: Diagnosis not present

## 2019-08-22 DIAGNOSIS — E559 Vitamin D deficiency, unspecified: Secondary | ICD-10-CM | POA: Diagnosis not present

## 2019-08-22 DIAGNOSIS — Z0000 Encounter for general adult medical examination without abnormal findings: Secondary | ICD-10-CM | POA: Diagnosis not present

## 2019-08-22 DIAGNOSIS — E7800 Pure hypercholesterolemia, unspecified: Secondary | ICD-10-CM | POA: Diagnosis not present

## 2019-08-22 DIAGNOSIS — Z23 Encounter for immunization: Secondary | ICD-10-CM | POA: Diagnosis not present

## 2019-08-22 DIAGNOSIS — Z1239 Encounter for other screening for malignant neoplasm of breast: Secondary | ICD-10-CM | POA: Diagnosis not present

## 2019-08-23 ENCOUNTER — Other Ambulatory Visit: Payer: Self-pay | Admitting: Internal Medicine

## 2019-08-23 DIAGNOSIS — Z1231 Encounter for screening mammogram for malignant neoplasm of breast: Secondary | ICD-10-CM

## 2019-09-19 ENCOUNTER — Other Ambulatory Visit: Payer: Self-pay | Admitting: Internal Medicine

## 2019-09-19 DIAGNOSIS — N63 Unspecified lump in unspecified breast: Secondary | ICD-10-CM

## 2019-09-28 ENCOUNTER — Ambulatory Visit
Admission: RE | Admit: 2019-09-28 | Discharge: 2019-09-28 | Disposition: A | Discharge status: Home / Self Care | Payer: BC Managed Care – PPO | Source: Ambulatory Visit | Attending: Internal Medicine | Admitting: Internal Medicine

## 2019-09-28 ENCOUNTER — Ambulatory Visit: Payer: BLUE CROSS/BLUE SHIELD

## 2019-09-28 ENCOUNTER — Other Ambulatory Visit: Payer: Self-pay

## 2019-09-28 DIAGNOSIS — N63 Unspecified lump in unspecified breast: Secondary | ICD-10-CM

## 2019-09-28 DIAGNOSIS — N6311 Unspecified lump in the right breast, upper outer quadrant: Secondary | ICD-10-CM | POA: Diagnosis not present

## 2019-10-03 DIAGNOSIS — H2513 Age-related nuclear cataract, bilateral: Secondary | ICD-10-CM | POA: Diagnosis not present

## 2019-10-03 DIAGNOSIS — H43811 Vitreous degeneration, right eye: Secondary | ICD-10-CM | POA: Diagnosis not present

## 2019-10-03 DIAGNOSIS — H02834 Dermatochalasis of left upper eyelid: Secondary | ICD-10-CM | POA: Diagnosis not present

## 2019-10-03 DIAGNOSIS — H02831 Dermatochalasis of right upper eyelid: Secondary | ICD-10-CM | POA: Diagnosis not present

## 2019-10-05 DIAGNOSIS — I788 Other diseases of capillaries: Secondary | ICD-10-CM | POA: Diagnosis not present

## 2019-10-05 DIAGNOSIS — Z85828 Personal history of other malignant neoplasm of skin: Secondary | ICD-10-CM | POA: Diagnosis not present

## 2019-10-05 DIAGNOSIS — L570 Actinic keratosis: Secondary | ICD-10-CM | POA: Diagnosis not present

## 2019-10-05 DIAGNOSIS — L821 Other seborrheic keratosis: Secondary | ICD-10-CM | POA: Diagnosis not present

## 2019-10-26 DIAGNOSIS — Z01419 Encounter for gynecological examination (general) (routine) without abnormal findings: Secondary | ICD-10-CM | POA: Diagnosis not present

## 2019-11-08 ENCOUNTER — Ambulatory Visit: Payer: BC Managed Care – PPO | Attending: Internal Medicine

## 2019-11-08 DIAGNOSIS — Z23 Encounter for immunization: Secondary | ICD-10-CM | POA: Insufficient documentation

## 2019-11-08 NOTE — Progress Notes (Signed)
   Covid-19 Vaccination Clinic  Name:  Claudia Robinson    MRN: [id number] DOB: 1951-03-24  11/08/2019  [name] was observed post Covid-19 immunization for 15 minutes without incidence. She was provided with Vaccine Information Sheet and instruction to access the V-Safe system.   [name] was instructed to call 911 with any severe reactions post vaccine: Mar[place] Kitchen Difficulty breathing  . Swelling of your face and throat  . A fast heartbeat  . A bad rash all over your body  . Dizziness and weakness    Immunizations Administered    Name Date Dose VIS Date Route   Pfizer COVID-19 Vaccine 11/08/2019  1:21 PM 0.3 mL 09/29/2019 Intramuscular   Manufacturer: [place]   Lot: [id number]   [place]: [id number]

## 2019-11-23 DIAGNOSIS — R030 Elevated blood-pressure reading, without diagnosis of hypertension: Secondary | ICD-10-CM | POA: Diagnosis not present

## 2019-11-23 DIAGNOSIS — E7800 Pure hypercholesterolemia, unspecified: Secondary | ICD-10-CM | POA: Diagnosis not present

## 2019-11-23 DIAGNOSIS — K222 Esophageal obstruction: Secondary | ICD-10-CM | POA: Diagnosis not present

## 2019-11-23 DIAGNOSIS — D110 Benign neoplasm of parotid gland: Secondary | ICD-10-CM | POA: Diagnosis not present

## 2019-11-29 ENCOUNTER — Ambulatory Visit: Payer: BC Managed Care – PPO | Attending: Internal Medicine

## 2019-11-29 DIAGNOSIS — Z23 Encounter for immunization: Secondary | ICD-10-CM | POA: Insufficient documentation

## 2019-11-29 NOTE — Progress Notes (Signed)
   Covid-19 Vaccination Clinic  Name:  Claudia Robinson    MRN: [id number] DOB: 01-31-1951  11/29/2019  Claudia Robinson was observed post Covid-19 immunization for 15 minutes without incidence. She was provided with Vaccine Information Sheet and instruction to access the V-Safe system.   Claudia Robinson was instructed to call 911 with any severe reactions post vaccine: Marland Kitchen Difficulty breathing  . Swelling of your face and throat  . A fast heartbeat  . A bad rash all over your body  . Dizziness and weakness    Immunizations Administered    Name Date Dose VIS Date Route   Pfizer COVID-19 Vaccine 11/29/2019  9:32 AM 0.3 mL 09/29/2019 Intramuscular   Manufacturer: [place]   Lot: [id number]   [place]: [id number]

## 2019-12-14 ENCOUNTER — Ambulatory Visit: Payer: BC Managed Care – PPO

## 2020-01-02 DIAGNOSIS — E7800 Pure hypercholesterolemia, unspecified: Secondary | ICD-10-CM | POA: Diagnosis not present

## 2020-01-02 DIAGNOSIS — E041 Nontoxic single thyroid nodule: Secondary | ICD-10-CM | POA: Diagnosis not present

## 2020-01-02 DIAGNOSIS — R030 Elevated blood-pressure reading, without diagnosis of hypertension: Secondary | ICD-10-CM | POA: Diagnosis not present

## 2020-01-02 DIAGNOSIS — D110 Benign neoplasm of parotid gland: Secondary | ICD-10-CM | POA: Diagnosis not present

## 2020-01-08 DIAGNOSIS — L570 Actinic keratosis: Secondary | ICD-10-CM | POA: Diagnosis not present

## 2020-02-28 DIAGNOSIS — J342 Deviated nasal septum: Secondary | ICD-10-CM | POA: Diagnosis not present

## 2020-02-28 DIAGNOSIS — J3489 Other specified disorders of nose and nasal sinuses: Secondary | ICD-10-CM | POA: Diagnosis not present

## 2020-02-28 DIAGNOSIS — K118 Other diseases of salivary glands: Secondary | ICD-10-CM | POA: Diagnosis not present

## 2020-02-28 DIAGNOSIS — E041 Nontoxic single thyroid nodule: Secondary | ICD-10-CM | POA: Diagnosis not present

## 2020-04-01 DIAGNOSIS — H43811 Vitreous degeneration, right eye: Secondary | ICD-10-CM | POA: Diagnosis not present

## 2020-04-01 DIAGNOSIS — H2513 Age-related nuclear cataract, bilateral: Secondary | ICD-10-CM | POA: Diagnosis not present

## 2020-04-01 DIAGNOSIS — H02831 Dermatochalasis of right upper eyelid: Secondary | ICD-10-CM | POA: Diagnosis not present

## 2020-04-01 DIAGNOSIS — H35411 Lattice degeneration of retina, right eye: Secondary | ICD-10-CM | POA: Diagnosis not present

## 2020-04-02 ENCOUNTER — Other Ambulatory Visit: Payer: Self-pay | Admitting: Otolaryngology

## 2020-04-02 ENCOUNTER — Ambulatory Visit
Admission: RE | Admit: 2020-04-02 | Discharge: 2020-04-02 | Disposition: A | Discharge status: Home / Self Care | Payer: BC Managed Care – PPO | Source: Ambulatory Visit | Attending: Otolaryngology | Admitting: Otolaryngology

## 2020-04-02 DIAGNOSIS — E041 Nontoxic single thyroid nodule: Secondary | ICD-10-CM | POA: Diagnosis not present

## 2020-04-02 DIAGNOSIS — K118 Other diseases of salivary glands: Secondary | ICD-10-CM

## 2020-04-02 MED ORDER — IOPAMIDOL (ISOVUE-300) INJECTION 61%
75.0000 mL | Freq: Once | INTRAVENOUS | Status: AC | PRN
  Administered 2020-04-02: 75 mL via INTRAVENOUS

## 2020-04-03 DIAGNOSIS — E041 Nontoxic single thyroid nodule: Secondary | ICD-10-CM | POA: Diagnosis not present

## 2020-04-03 DIAGNOSIS — K118 Other diseases of salivary glands: Secondary | ICD-10-CM | POA: Diagnosis not present

## 2020-04-04 DIAGNOSIS — R030 Elevated blood-pressure reading, without diagnosis of hypertension: Secondary | ICD-10-CM | POA: Diagnosis not present

## 2020-04-04 DIAGNOSIS — D110 Benign neoplasm of parotid gland: Secondary | ICD-10-CM | POA: Diagnosis not present

## 2020-04-04 DIAGNOSIS — E041 Nontoxic single thyroid nodule: Secondary | ICD-10-CM | POA: Diagnosis not present

## 2020-04-04 DIAGNOSIS — E7800 Pure hypercholesterolemia, unspecified: Secondary | ICD-10-CM | POA: Diagnosis not present

## 2020-04-18 DIAGNOSIS — R1311 Dysphagia, oral phase: Secondary | ICD-10-CM | POA: Diagnosis not present

## 2020-04-18 DIAGNOSIS — D110 Benign neoplasm of parotid gland: Secondary | ICD-10-CM | POA: Diagnosis not present

## 2020-04-18 DIAGNOSIS — K2100 Gastro-esophageal reflux disease with esophagitis, without bleeding: Secondary | ICD-10-CM | POA: Diagnosis not present

## 2020-04-18 DIAGNOSIS — K222 Esophageal obstruction: Secondary | ICD-10-CM | POA: Diagnosis not present

## 2020-05-07 DIAGNOSIS — R030 Elevated blood-pressure reading, without diagnosis of hypertension: Secondary | ICD-10-CM | POA: Diagnosis not present

## 2020-05-24 DIAGNOSIS — K2100 Gastro-esophageal reflux disease with esophagitis, without bleeding: Secondary | ICD-10-CM | POA: Diagnosis not present

## 2020-05-24 DIAGNOSIS — K222 Esophageal obstruction: Secondary | ICD-10-CM | POA: Diagnosis not present

## 2020-05-24 DIAGNOSIS — K317 Polyp of stomach and duodenum: Secondary | ICD-10-CM | POA: Diagnosis not present

## 2020-05-24 DIAGNOSIS — R1310 Dysphagia, unspecified: Secondary | ICD-10-CM | POA: Diagnosis not present

## 2020-05-24 DIAGNOSIS — K449 Diaphragmatic hernia without obstruction or gangrene: Secondary | ICD-10-CM | POA: Diagnosis not present

## 2020-06-19 DIAGNOSIS — L570 Actinic keratosis: Secondary | ICD-10-CM | POA: Diagnosis not present

## 2020-07-10 DIAGNOSIS — R0683 Snoring: Secondary | ICD-10-CM | POA: Diagnosis not present

## 2020-07-10 DIAGNOSIS — K222 Esophageal obstruction: Secondary | ICD-10-CM | POA: Diagnosis not present

## 2020-07-10 DIAGNOSIS — D110 Benign neoplasm of parotid gland: Secondary | ICD-10-CM | POA: Diagnosis not present

## 2020-07-10 DIAGNOSIS — R030 Elevated blood-pressure reading, without diagnosis of hypertension: Secondary | ICD-10-CM | POA: Diagnosis not present

## 2020-07-26 DIAGNOSIS — Z23 Encounter for immunization: Secondary | ICD-10-CM | POA: Diagnosis not present

## 2020-08-03 ENCOUNTER — Ambulatory Visit: Payer: BC Managed Care – PPO | Attending: Internal Medicine

## 2020-08-03 ENCOUNTER — Other Ambulatory Visit: Payer: Self-pay

## 2020-08-03 DIAGNOSIS — Z23 Encounter for immunization: Secondary | ICD-10-CM

## 2020-08-03 NOTE — Progress Notes (Signed)
° °  Covid-19 Vaccination Clinic  Name:  Claudia Robinson    MRN: [id number] DOB: [birth year]  08/03/2020  [name] was observed post Covid-19 immunization for 15 minutes without incident. She was provided with Vaccine Information Sheet and instruction to access the V-Safe system.   [name] was instructed to call 911 with any severe reactions post vaccine:  Difficulty breathing   Swelling of face and throat   A fast heartbeat   A bad rash all over body   Dizziness and weakness

## 2020-08-14 DIAGNOSIS — K222 Esophageal obstruction: Secondary | ICD-10-CM | POA: Diagnosis not present

## 2020-08-14 DIAGNOSIS — R030 Elevated blood-pressure reading, without diagnosis of hypertension: Secondary | ICD-10-CM | POA: Diagnosis not present

## 2020-08-14 DIAGNOSIS — R0683 Snoring: Secondary | ICD-10-CM | POA: Diagnosis not present

## 2020-08-14 DIAGNOSIS — D110 Benign neoplasm of parotid gland: Secondary | ICD-10-CM | POA: Diagnosis not present

## 2020-09-10 IMAGING — CT CT NECK WITH CONTRAST
2 of 4 series · 5 of 14 positions shown, 6 images · IV contrast (iopamidol)
Comparison: None.
COMPARISON: None.

CLINICAL DATA: Right parotid mass.

Creatinine was obtained on site at [HOSPITAL] at [HOSPITAL].
Results: Creatinine 0.7 mg/dL.
EXAM:
CT NECK WITH CONTRAST
TECHNIQUE: Multidetector CT imaging of the neck was performed using the
standard protocol following the bolus administration of intravenous
contrast.
CONTRAST:  75mL CGN2SH-PJJ IOPAMIDOL (CGN2SH-PJJ) INJECTION 61%

[Series 3: neck · axial · 0.50mm/px · z∈[-262,-182]mm · 2 of 120 slices shown]
[im 40/120  bone]
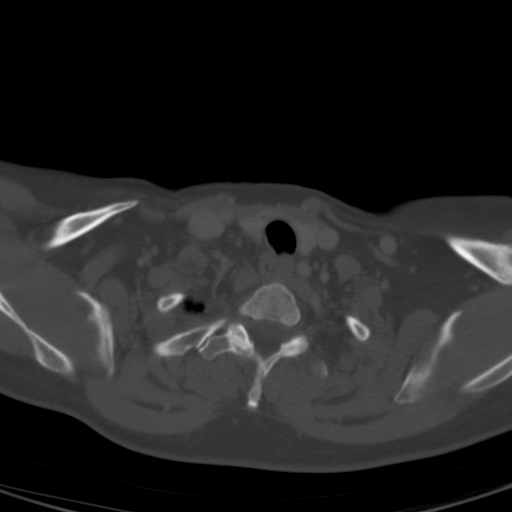
[im 80/120  bone]
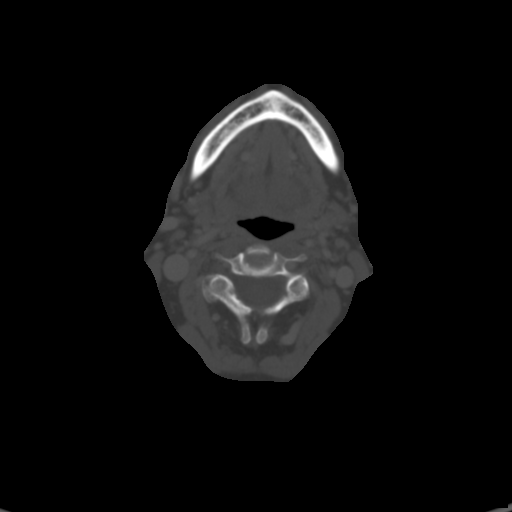

[Series 8: angled axial-oropharynx · axial · 0.48mm/px · z∈[-305,-189]mm · 3 of 120 slices shown, 4 images]
[im 30/120  soft-tissue]
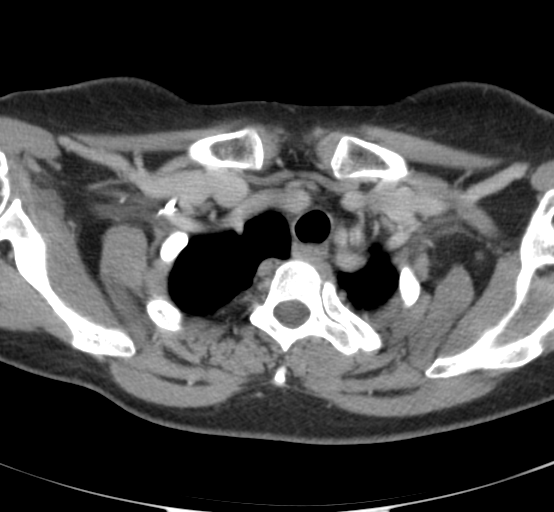
[im 30/120  bone]
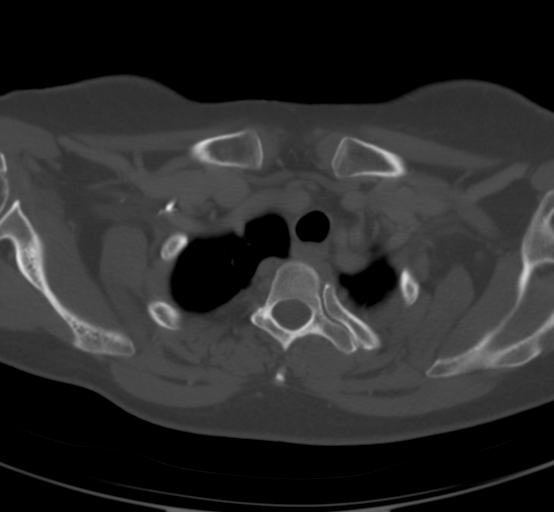
[im 60/120  bone]
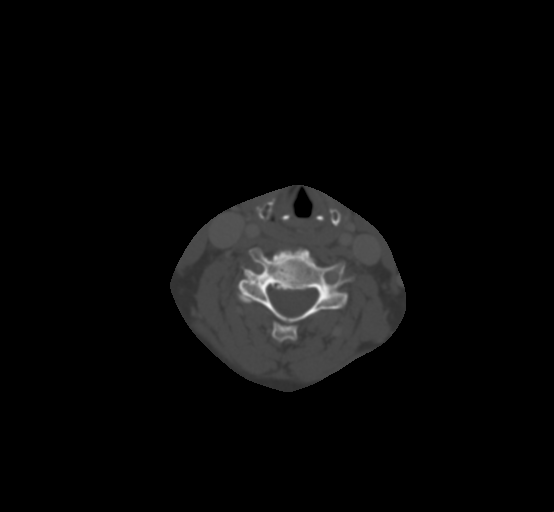
[im 90/120  bone]
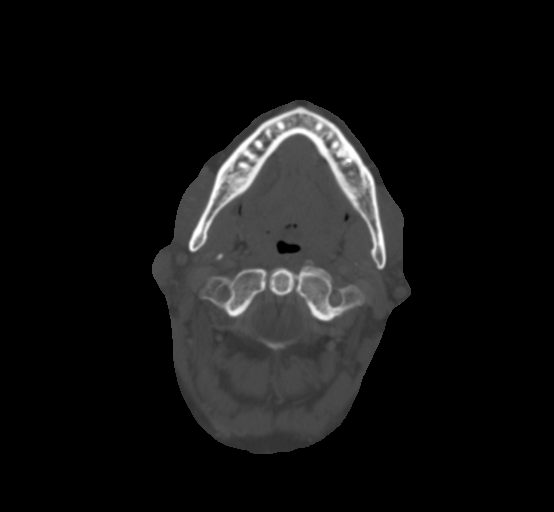

[5 of 14 positions shown; findings below may reference images not displayed]

FINDINGS: Pharynx and larynx: No evidence of pharyngeal mass or swelling. No
retropharyngeal fluid. Unremarkable larynx.

Salivary glands: A homogeneous soft tissue mass superficially in the
right parotid gland measures 2.5 x 1.5 cm and corresponds to the
palpable abnormality. The left parotid gland and both submandibular
glands are unremarkable. No salivary calculi or acute inflammatory
changes are evident.

Thyroid: 1.1 cm heterogeneously hypoattenuating left thyroid nodule.

Lymph nodes: No enlarged or suspicious lymph nodes in the neck.

Vascular: Major vascular structures of the neck are patent.

Limited intracranial: Unremarkable.

Visualized orbits: Unremarkable.

Mastoids and visualized paranasal sinuses: Subcentimeter right
maxillary sinus mucous retention cyst. Clear mastoid air cells.

Skeleton: No suspicious osseous lesion. Advanced cervical facet
arthrosis with grade 1 anterolisthesis of C3 on C4 and C7 on T1.
Advanced disc degeneration from C4-5 to C6-7 with severe right
neural foraminal stenosis at C4-5 and C5-6. Severe thoracic
dextroscoliosis and mild cervical levoscoliosis.

Upper chest: Mild centrilobular and paraseptal emphysema.

Other: None.
FINDINGS: Pharynx and larynx: No evidence of pharyngeal mass or swelling. No
retropharyngeal fluid. Unremarkable larynx.

Salivary glands: A homogeneous soft tissue mass superficially in the
right parotid gland measures 2.5 x 1.5 cm and corresponds to the
palpable abnormality. The left parotid gland and both submandibular
glands are unremarkable. No salivary calculi or acute inflammatory
changes are evident.

Thyroid: 1.1 cm heterogeneously hypoattenuating left thyroid nodule.

Lymph nodes: No enlarged or suspicious lymph nodes in the neck.

Vascular: Major vascular structures of the neck are patent.

Limited intracranial: Unremarkable.

Visualized orbits: Unremarkable.

Mastoids and visualized paranasal sinuses: Subcentimeter right
maxillary sinus mucous retention cyst. Clear mastoid air cells.

Skeleton: No suspicious osseous lesion. Advanced cervical facet
arthrosis with grade 1 anterolisthesis of C3 on C4 and C7 on T1.
Advanced disc degeneration from C4-5 to C6-7 with severe right
neural foraminal stenosis at C4-5 and C5-6. Severe thoracic
dextroscoliosis and mild cervical levoscoliosis.

Upper chest: Mild centrilobular and paraseptal emphysema.

Other: None.
IMPRESSION: 2.5 cm right parotid mass compatible with a primary parotid
neoplasm. No evidence of cervical lymphadenopathy.

## 2020-11-19 DIAGNOSIS — L821 Other seborrheic keratosis: Secondary | ICD-10-CM | POA: Diagnosis not present

## 2020-11-19 DIAGNOSIS — L814 Other melanin hyperpigmentation: Secondary | ICD-10-CM | POA: Diagnosis not present

## 2020-11-19 DIAGNOSIS — L570 Actinic keratosis: Secondary | ICD-10-CM | POA: Diagnosis not present

## 2020-11-19 DIAGNOSIS — Z85828 Personal history of other malignant neoplasm of skin: Secondary | ICD-10-CM | POA: Diagnosis not present

## 2020-11-19 DIAGNOSIS — B078 Other viral warts: Secondary | ICD-10-CM | POA: Diagnosis not present

## 2020-11-19 DIAGNOSIS — L820 Inflamed seborrheic keratosis: Secondary | ICD-10-CM | POA: Diagnosis not present

## 2020-11-20 DIAGNOSIS — K222 Esophageal obstruction: Secondary | ICD-10-CM | POA: Diagnosis not present

## 2020-11-20 DIAGNOSIS — Z79899 Other long term (current) drug therapy: Secondary | ICD-10-CM | POA: Diagnosis not present

## 2020-11-20 DIAGNOSIS — E559 Vitamin D deficiency, unspecified: Secondary | ICD-10-CM | POA: Diagnosis not present

## 2020-11-20 DIAGNOSIS — G479 Sleep disorder, unspecified: Secondary | ICD-10-CM | POA: Diagnosis not present

## 2020-11-20 DIAGNOSIS — D110 Benign neoplasm of parotid gland: Secondary | ICD-10-CM | POA: Diagnosis not present

## 2020-11-20 DIAGNOSIS — Z0000 Encounter for general adult medical examination without abnormal findings: Secondary | ICD-10-CM | POA: Diagnosis not present

## 2020-11-20 DIAGNOSIS — E7800 Pure hypercholesterolemia, unspecified: Secondary | ICD-10-CM | POA: Diagnosis not present

## 2020-11-20 DIAGNOSIS — R030 Elevated blood-pressure reading, without diagnosis of hypertension: Secondary | ICD-10-CM | POA: Diagnosis not present

## 2020-12-06 DIAGNOSIS — K118 Other diseases of salivary glands: Secondary | ICD-10-CM | POA: Diagnosis not present

## 2021-01-24 ENCOUNTER — Ambulatory Visit: Admit: 2021-01-24 | Payer: BC Managed Care – PPO | Admitting: Otolaryngology

## 2021-01-24 SURGERY — EXCISION, PAROTID GLAND
Anesthesia: General | Laterality: Right

## 2021-01-29 DIAGNOSIS — H524 Presbyopia: Secondary | ICD-10-CM | POA: Diagnosis not present

## 2021-01-29 DIAGNOSIS — H02055 Trichiasis without entropian left lower eyelid: Secondary | ICD-10-CM | POA: Diagnosis not present

## 2021-01-29 DIAGNOSIS — H35371 Puckering of macula, right eye: Secondary | ICD-10-CM | POA: Diagnosis not present

## 2021-01-29 DIAGNOSIS — H2513 Age-related nuclear cataract, bilateral: Secondary | ICD-10-CM | POA: Diagnosis not present

## 2021-02-24 ENCOUNTER — Other Ambulatory Visit (HOSPITAL_COMMUNITY)
Admission: RE | Admit: 2021-02-24 | Discharge: 2021-02-24 | Disposition: A | Discharge status: Home / Self Care | Payer: BC Managed Care – PPO | Source: Ambulatory Visit | Attending: Otolaryngology | Admitting: Otolaryngology

## 2021-02-24 DIAGNOSIS — Z20822 Contact with and (suspected) exposure to covid-19: Secondary | ICD-10-CM | POA: Insufficient documentation

## 2021-02-24 DIAGNOSIS — Z01812 Encounter for preprocedural laboratory examination: Secondary | ICD-10-CM | POA: Insufficient documentation

## 2021-02-25 ENCOUNTER — Encounter (HOSPITAL_COMMUNITY): Payer: Self-pay | Admitting: Otolaryngology

## 2021-02-25 LAB — SARS CORONAVIRUS 2 (TAT 6-24 HRS): SARS Coronavirus 2: NEGATIVE

## 2021-02-25 NOTE — Progress Notes (Signed)
Spoke with pt for pre-op call. Pt denies cardiac history, HTN or Diabetes.  Covid test done 02/24/21 and it is negative. Pt states she did go out to lunch with a friend today. States she doesn't remember hearing that she needed to quarantine. I did tell her that she does need to quarantine and it might be a possibility that she will have to have another Covid test in the AM. She voiced understanding.

## 2021-02-26 ENCOUNTER — Other Ambulatory Visit: Payer: Self-pay

## 2021-02-26 ENCOUNTER — Ambulatory Visit (HOSPITAL_COMMUNITY): Payer: BC Managed Care – PPO | Admitting: Certified Registered Nurse Anesthetist

## 2021-02-26 ENCOUNTER — Observation Stay (HOSPITAL_COMMUNITY)
Admission: RE | Admit: 2021-02-26 | Discharge: 2021-02-27 | Disposition: A | Discharge status: Home / Self Care | Payer: BC Managed Care – PPO | Attending: Otolaryngology | Admitting: Otolaryngology

## 2021-02-26 ENCOUNTER — Encounter (HOSPITAL_COMMUNITY)
Admission: RE | Disposition: A | Discharge status: Home / Self Care | Payer: Self-pay | Source: Home / Self Care | Attending: Otolaryngology

## 2021-02-26 ENCOUNTER — Encounter (HOSPITAL_COMMUNITY): Payer: Self-pay | Admitting: Otolaryngology

## 2021-02-26 DIAGNOSIS — D110 Benign neoplasm of parotid gland: Principal | ICD-10-CM | POA: Insufficient documentation

## 2021-02-26 DIAGNOSIS — Z85828 Personal history of other malignant neoplasm of skin: Secondary | ICD-10-CM | POA: Insufficient documentation

## 2021-02-26 DIAGNOSIS — K219 Gastro-esophageal reflux disease without esophagitis: Secondary | ICD-10-CM | POA: Diagnosis not present

## 2021-02-26 DIAGNOSIS — K118 Other diseases of salivary glands: Secondary | ICD-10-CM | POA: Diagnosis not present

## 2021-02-26 DIAGNOSIS — F419 Anxiety disorder, unspecified: Secondary | ICD-10-CM | POA: Diagnosis not present

## 2021-02-26 DIAGNOSIS — E7800 Pure hypercholesterolemia, unspecified: Secondary | ICD-10-CM | POA: Diagnosis not present

## 2021-02-26 HISTORY — DX: Esophageal obstruction: K22.2

## 2021-02-26 HISTORY — DX: Diverticulosis of intestine, part unspecified, without perforation or abscess without bleeding: K57.90

## 2021-02-26 HISTORY — DX: Malignant (primary) neoplasm, unspecified: C80.1

## 2021-02-26 HISTORY — DX: Pure hypercholesterolemia, unspecified: E78.00

## 2021-02-26 HISTORY — DX: Anemia, unspecified: D64.9

## 2021-02-26 HISTORY — DX: Anxiety disorder, unspecified: F41.9

## 2021-02-26 HISTORY — DX: Other constipation: K59.09

## 2021-02-26 HISTORY — DX: Gastro-esophageal reflux disease without esophagitis: K21.9

## 2021-02-26 HISTORY — PX: PAROTIDECTOMY: SHX2163

## 2021-02-26 LAB — COMPREHENSIVE METABOLIC PANEL
ALT: 21 U/L (ref 0–44)
AST: 20 U/L (ref 15–41)
Albumin: 3.7 g/dL (ref 3.5–5.0)
Alkaline Phosphatase: 67 U/L (ref 38–126)
Anion gap: 6 (ref 5–15)
BUN: 11 mg/dL (ref 8–23)
CO2: 27 mmol/L (ref 22–32)
Calcium: 9 mg/dL (ref 8.9–10.3)
Chloride: 101 mmol/L (ref 98–111)
Creatinine, Ser: 0.78 mg/dL (ref 0.44–1.00)
GFR, Estimated: >60 mL/min (ref >60)
Glucose, Bld: 91 mg/dL (ref 70–99)
Potassium: 4.2 mmol/L (ref 3.5–5.1)
Sodium: 134 mmol/L — ABNORMAL LOW (ref 135–145)
Total Bilirubin: 0.3 mg/dL (ref 0.3–1.2)
Total Protein: 6.7 g/dL (ref 6.5–8.1)

## 2021-02-26 LAB — CBC
HCT: 36.3 % (ref 36.0–46.0)
Hemoglobin: 12.5 g/dL (ref 12.0–15.0)
MCH: 33.5 pg (ref 26.0–34.0)
MCHC: 34.4 g/dL (ref 30.0–36.0)
MCV: 97.3 fL (ref 80.0–100.0)
Platelets: 292 10*3/uL (ref 150–400)
RBC: 3.73 MIL/uL — ABNORMAL LOW (ref 3.87–5.11)
RDW: 13 % (ref 11.5–15.5)
WBC: 6.8 10*3/uL (ref 4.0–10.5)
nRBC: 0 % (ref 0.0–0.2)

## 2021-02-26 SURGERY — EXCISION, PAROTID GLAND
Anesthesia: General | Laterality: Right

## 2021-02-26 MED ORDER — PROPOFOL 10 MG/ML IV BOLUS
INTRAVENOUS | Status: DC | PRN
  Administered 2021-02-26: 200 mg via INTRAVENOUS
  Administered 2021-02-26: 30 mg via INTRAVENOUS

## 2021-02-26 MED ORDER — CEFAZOLIN SODIUM-DEXTROSE 2-4 GM/100ML-% IV SOLN
INTRAVENOUS | Status: AC
  Filled 2021-02-26: qty 100

## 2021-02-26 MED ORDER — DEXAMETHASONE SODIUM PHOSPHATE 10 MG/ML IJ SOLN
INTRAMUSCULAR | Status: DC | PRN
  Administered 2021-02-26: 5 mg via INTRAVENOUS

## 2021-02-26 MED ORDER — EPHEDRINE SULFATE 50 MG/ML IJ SOLN
INTRAMUSCULAR | Status: DC | PRN
  Administered 2021-02-26: 5 mg via INTRAVENOUS

## 2021-02-26 MED ORDER — KETOROLAC TROMETHAMINE 30 MG/ML IJ SOLN
30.0000 mg | Freq: Four times a day (QID) | INTRAMUSCULAR | Status: DC | PRN
  Administered 2021-02-26: 30 mg via INTRAVENOUS
  Filled 2021-02-26: qty 1

## 2021-02-26 MED ORDER — ACETAMINOPHEN 650 MG RE SUPP: 650.0000 mg | RECTAL | Status: DC | PRN

## 2021-02-26 MED ORDER — MORPHINE SULFATE (PF) 2 MG/ML IV SOLN
2.0000 mg | INTRAVENOUS | Status: DC | PRN
  Filled 2021-02-26: qty 1

## 2021-02-26 MED ORDER — ONDANSETRON 4 MG PO TBDP: 4.0000 mg | ORAL_TABLET | Freq: Three times a day (TID) | ORAL | Status: DC | PRN

## 2021-02-26 MED ORDER — IBUPROFEN 600 MG PO TABS
600.0000 mg | ORAL_TABLET | Freq: Four times a day (QID) | ORAL | Status: DC | PRN
  Administered 2021-02-26: 600 mg via ORAL
  Filled 2021-02-26: qty 1

## 2021-02-26 MED ORDER — KETOROLAC TROMETHAMINE 15 MG/ML IJ SOLN
INTRAMUSCULAR | Status: AC
  Filled 2021-02-26: qty 1

## 2021-02-26 MED ORDER — ORAL CARE MOUTH RINSE: 15.0000 mL | Freq: Once | OROMUCOSAL | Status: AC

## 2021-02-26 MED ORDER — PHENYLEPHRINE HCL (PRESSORS) 10 MG/ML IV SOLN
INTRAVENOUS | Status: DC | PRN
  Administered 2021-02-26 (×2): 80 ug via INTRAVENOUS

## 2021-02-26 MED ORDER — FENTANYL CITRATE (PF) 250 MCG/5ML IJ SOLN
INTRAMUSCULAR | Status: AC
  Filled 2021-02-26: qty 5

## 2021-02-26 MED ORDER — PANTOPRAZOLE SODIUM 40 MG PO TBEC: 40.0000 mg | DELAYED_RELEASE_TABLET | Freq: Every day | ORAL | Status: DC

## 2021-02-26 MED ORDER — ONDANSETRON HCL 4 MG/2ML IJ SOLN
4.0000 mg | INTRAMUSCULAR | Status: DC | PRN
Start: 2021-02-26 — End: 2021-02-27

## 2021-02-26 MED ORDER — PROPOFOL 10 MG/ML IV BOLUS
INTRAVENOUS | Status: AC
  Filled 2021-02-26: qty 40

## 2021-02-26 MED ORDER — 0.9 % SODIUM CHLORIDE (POUR BTL) OPTIME
TOPICAL | Status: DC | PRN
  Administered 2021-02-26: 1000 mL

## 2021-02-26 MED ORDER — AMISULPRIDE (ANTIEMETIC) 5 MG/2ML IV SOLN: 10.0000 mg | Freq: Once | INTRAVENOUS | Status: DC | PRN

## 2021-02-26 MED ORDER — ONDANSETRON 4 MG PO TBDP: 4.0000 mg | ORAL_TABLET | Freq: Three times a day (TID) | ORAL | 0 refills | Status: AC | PRN

## 2021-02-26 MED ORDER — ACETAMINOPHEN 160 MG/5ML PO SOLN: 650.0000 mg | ORAL | Status: DC | PRN

## 2021-02-26 MED ORDER — CEFAZOLIN SODIUM-DEXTROSE 2-3 GM-%(50ML) IV SOLR
INTRAVENOUS | Status: DC | PRN
  Administered 2021-02-26: 2 g via INTRAVENOUS

## 2021-02-26 MED ORDER — LIDOCAINE-EPINEPHRINE 1 %-1:100000 IJ SOLN
INTRAMUSCULAR | Status: DC | PRN
  Administered 2021-02-26: 4 mL

## 2021-02-26 MED ORDER — FENTANYL CITRATE (PF) 100 MCG/2ML IJ SOLN
INTRAMUSCULAR | Status: AC
  Filled 2021-02-26: qty 2

## 2021-02-26 MED ORDER — FENTANYL CITRATE (PF) 100 MCG/2ML IJ SOLN
25.0000 ug | INTRAMUSCULAR | Status: DC | PRN
  Administered 2021-02-26 (×2): 50 ug via INTRAVENOUS

## 2021-02-26 MED ORDER — ONDANSETRON HCL 4 MG/2ML IJ SOLN: 4.0000 mg | Freq: Once | INTRAMUSCULAR | Status: DC | PRN

## 2021-02-26 MED ORDER — ONDANSETRON HCL 4 MG PO TABS: 4.0000 mg | ORAL_TABLET | ORAL | Status: DC | PRN

## 2021-02-26 MED ORDER — LIDOCAINE-EPINEPHRINE 1 %-1:100000 IJ SOLN
INTRAMUSCULAR | Status: AC
  Filled 2021-02-26: qty 1

## 2021-02-26 MED ORDER — CHLORHEXIDINE GLUCONATE 0.12 % MT SOLN
15.0000 mL | Freq: Once | OROMUCOSAL | Status: AC
  Administered 2021-02-26: 15 mL via OROMUCOSAL
  Filled 2021-02-26: qty 15

## 2021-02-26 MED ORDER — MIDAZOLAM HCL 2 MG/2ML IJ SOLN
INTRAMUSCULAR | Status: AC
  Filled 2021-02-26: qty 2

## 2021-02-26 MED ORDER — ACETAMINOPHEN 500 MG PO TABS
1000.0000 mg | ORAL_TABLET | Freq: Once | ORAL | Status: AC
  Administered 2021-02-26: 1000 mg via ORAL
  Filled 2021-02-26: qty 2

## 2021-02-26 MED ORDER — PHENYLEPHRINE HCL-NACL 10-0.9 MG/250ML-% IV SOLN
INTRAVENOUS | Status: DC | PRN
  Administered 2021-02-26: 25 ug/min via INTRAVENOUS

## 2021-02-26 MED ORDER — DEXTROSE IN LACTATED RINGERS 5 % IV SOLN: INTRAVENOUS | Status: DC

## 2021-02-26 MED ORDER — SUCCINYLCHOLINE CHLORIDE 200 MG/10ML IV SOSY
PREFILLED_SYRINGE | INTRAVENOUS | Status: DC | PRN
  Administered 2021-02-26: 60 mg via INTRAVENOUS

## 2021-02-26 MED ORDER — LIDOCAINE 2% (20 MG/ML) 5 ML SYRINGE
INTRAMUSCULAR | Status: DC | PRN
  Administered 2021-02-26: 60 mg via INTRAVENOUS

## 2021-02-26 MED ORDER — HYDROCODONE-ACETAMINOPHEN 5-325 MG PO TABS
1.0000 | ORAL_TABLET | Freq: Four times a day (QID) | ORAL | Status: DC | PRN
  Administered 2021-02-26: 2 via ORAL
  Administered 2021-02-27: 1 via ORAL
  Administered 2021-02-27: 2 via ORAL
  Filled 2021-02-26 (×2): qty 2
  Filled 2021-02-26: qty 1

## 2021-02-26 MED ORDER — MIDAZOLAM HCL 5 MG/5ML IJ SOLN
INTRAMUSCULAR | Status: DC | PRN
  Administered 2021-02-26: 1 mg via INTRAVENOUS

## 2021-02-26 MED ORDER — FENTANYL CITRATE (PF) 250 MCG/5ML IJ SOLN
INTRAMUSCULAR | Status: DC | PRN
  Administered 2021-02-26: 150 ug via INTRAVENOUS
  Administered 2021-02-26 (×2): 50 ug via INTRAVENOUS

## 2021-02-26 MED ORDER — ONDANSETRON HCL 4 MG/2ML IJ SOLN
INTRAMUSCULAR | Status: DC | PRN
  Administered 2021-02-26: 4 mg via INTRAVENOUS

## 2021-02-26 MED ORDER — ATORVASTATIN CALCIUM 10 MG PO TABS
10.0000 mg | ORAL_TABLET | ORAL | Status: DC
  Filled 2021-02-26: qty 1

## 2021-02-26 MED ORDER — LACTATED RINGERS IV SOLN: INTRAVENOUS | Status: DC

## 2021-02-26 MED ORDER — KETOROLAC TROMETHAMINE 15 MG/ML IJ SOLN
15.0000 mg | Freq: Once | INTRAMUSCULAR | Status: AC | PRN
  Administered 2021-02-26: 15 mg via INTRAVENOUS

## 2021-02-26 MED ORDER — HYDROCODONE-ACETAMINOPHEN 5-325 MG PO TABS: ORAL_TABLET | ORAL | 0 refills | Status: AC

## 2021-02-26 SURGICAL SUPPLY — 56 items
ADH SKN CLS APL DERMABOND .7 (GAUZE/BANDAGES/DRESSINGS) ×1
ATTRACTOMAT 16X20 MAGNETIC DRP (DRAPES) IMPLANT
BLADE CLIPPER SURG (BLADE) IMPLANT
BLADE SURG 15 STRL LF DISP TIS (BLADE) IMPLANT
BLADE SURG 15 STRL SS (BLADE)
CABLE BIPOLOR RESECTION CORD (MISCELLANEOUS) ×2 IMPLANT
CANISTER SUCT 3000ML PPV (MISCELLANEOUS) ×2 IMPLANT
CLEANER TIP ELECTROSURG 2X2 (MISCELLANEOUS) ×2 IMPLANT
CNTNR URN SCR LID CUP LEK RST (MISCELLANEOUS) ×1 IMPLANT
CONT SPEC 4OZ STRL OR WHT (MISCELLANEOUS) ×2
COVER SURGICAL LIGHT HANDLE (MISCELLANEOUS) ×2 IMPLANT
COVER WAND RF STERILE (DRAPES) ×2 IMPLANT
DERMABOND ADVANCED (GAUZE/BANDAGES/DRESSINGS) ×1
DERMABOND ADVANCED .7 DNX12 (GAUZE/BANDAGES/DRESSINGS) ×1 IMPLANT
DRAIN JACKSON RD 7FR 3/32 (WOUND CARE) ×1 IMPLANT
DRAIN PENROSE 1/4X12 LTX STRL (WOUND CARE) IMPLANT
DRAIN SNY 10 ROU (WOUND CARE) IMPLANT
DRAPE HALF SHEET 40X57 (DRAPES) IMPLANT
DRAPE SURG 17X23 STRL (DRAPES) ×2 IMPLANT
DRSG TEGADERM 2-3/8X2-3/4 SM (GAUZE/BANDAGES/DRESSINGS) ×10 IMPLANT
ELECT COATED BLADE 2.86 ST (ELECTRODE) ×2 IMPLANT
ELECT PAIRED SUBDERMAL (MISCELLANEOUS) ×2
ELECT REM PT RETURN 9FT ADLT (ELECTROSURGICAL) ×2
ELECTRODE PAIRED SUBDERMAL (MISCELLANEOUS) ×1 IMPLANT
ELECTRODE REM PT RTRN 9FT ADLT (ELECTROSURGICAL) ×1 IMPLANT
EVACUATOR SILICONE 100CC (DRAIN) ×1 IMPLANT
FORCEPS BIPOLAR SPETZLER 8 1.0 (NEUROSURGERY SUPPLIES) ×2 IMPLANT
GAUZE 4X4 16PLY RFD (DISPOSABLE) ×2 IMPLANT
GLOVE BIOGEL M 7.0 STRL (GLOVE) ×2 IMPLANT
GOWN STRL REUS W/ TWL LRG LVL3 (GOWN DISPOSABLE) ×2 IMPLANT
GOWN STRL REUS W/TWL LRG LVL3 (GOWN DISPOSABLE) ×4
KIT BASIN OR (CUSTOM PROCEDURE TRAY) ×2 IMPLANT
KIT TURNOVER KIT B (KITS) ×2 IMPLANT
NDL HYPO 25GX1X1/2 BEV (NEEDLE) ×1 IMPLANT
NEEDLE HYPO 25GX1X1/2 BEV (NEEDLE) ×2 IMPLANT
NS IRRIG 1000ML POUR BTL (IV SOLUTION) ×2 IMPLANT
PAD ARMBOARD 7.5X6 YLW CONV (MISCELLANEOUS) ×4 IMPLANT
PENCIL SMOKE EVACUATOR (MISCELLANEOUS) ×2 IMPLANT
PROBE NERVBE PRASS .33 (MISCELLANEOUS) ×2 IMPLANT
SHEARS HARMONIC 9CM CVD (BLADE) ×2 IMPLANT
SPONGE INTESTINAL PEANUT (DISPOSABLE) IMPLANT
STAPLER VISISTAT 35W (STAPLE) ×2 IMPLANT
SUT ETHILON 3 0 PS 1 (SUTURE) ×1 IMPLANT
SUT ETHILON 5 0 P 3 18 (SUTURE)
SUT ETHILON 6 0 P 1 (SUTURE) IMPLANT
SUT NYLON ETHILON 5-0 P-3 1X18 (SUTURE) IMPLANT
SUT SILK 2 0 PERMA HAND 18 BK (SUTURE) IMPLANT
SUT SILK 2 0 REEL (SUTURE) ×2 IMPLANT
SUT SILK 2 0 SH CR/8 (SUTURE) ×2 IMPLANT
SUT SILK 3 0 REEL (SUTURE) ×2 IMPLANT
SUT VIC AB 4-0 PS2 27 (SUTURE) ×1 IMPLANT
SUT VIC AB 5-0 P-3 18XBRD (SUTURE) ×1 IMPLANT
SUT VIC AB 5-0 P3 18 (SUTURE) ×2
SUT VIC AB 5-0 PS2 18 (SUTURE) ×1 IMPLANT
SUT VICRYL 4-0 PS2 18IN ABS (SUTURE) ×2 IMPLANT
TRAY ENT MC OR (CUSTOM PROCEDURE TRAY) ×2 IMPLANT

## 2021-02-26 NOTE — Progress Notes (Signed)
Patient does not need to be retested for Covid per Dr. Roanna Banning.

## 2021-02-26 NOTE — Op Note (Signed)
Operative Note: PAROTIDECTOMY  Patient: Claudia Robinson record number: [id number]  Date:02/26/2021  Pre-operative Indications: Right parotid Mass  Postoperative Indications: Same  Surgical Procedure: Right total parotidectomy with NIMS Monitoring  Anesthesia: GET  Surgeon: Delsa Bern, M.D.  Assist: Jolene Provost, PA  Ms. Nordbladh's assistance was required throughout the surgical procedure including surgical planning, retraction, management of bleeding and surgical decision-making throughout the operation.  Complications: None  EBL: 50 cc   Brief History: The patient is a 70 y.o. female with a history of right parotid mass.  The patient noted gradual enlargement of the mass over several years and the CT scan showed a 3 cm right parotid mass posterior and anterior to the right mandible. Given the patient's history and findings I recommended right parotidectomy with nerve monitoring under general anesthesia.  The  risks and benefits were discussed in detail with the patient and their family. They understand and agree with our plan for surgery which is scheduled at [facility] on an elective basis.  Surgical Procedure: The patient is brought to the operating room on 02/26/2021 and placed in supine position on the operating table. General endotracheal anesthesia was established without difficulty. When the patient was adequately anesthetized, surgical timeout was performed and correct identification of the patient and the surgical procedure.  The patient was injected with 4 cc of 1% lidocaine 1-100,000 dilution epinephrine.  The [employer] Nerve Integrity Monitoring System (NIMS) was placed and nerve monitoring was used throughout the facial nerve dissection component of the surgical procedure.  The patient was positioned and prepped and draped in sterile fashion.  With the patient prepped and positioned, right total parotidectomy was undertaken.  A curvilinear incision  was created in the preauricular skin crease and carried inferiorly around the earlobe and into the upper neck in a pre-existing skin crease using a #15 scalpel.  Subcutaneous soft tissue was then dissected and incised with Bovie electrocautery to the level of the superficial parotid fascia.  Dissection was carried along the superficial fascia from posterior to anterior elevating skin and muscle layer.  Dissection was then carried along the tragal cartilage from superficial to deep elevating the parotid gland anteriorly.  The inferior aspect of the incision was then dissected carefully, the anterior aspect of the sternocleidomastoid muscle was dissected and the parotid gland was reflected anteriorly.  The parotid mass was fully visualized in the posterior and lateral aspect of the gland.  Given the location, we opted to perform a total parotidectomy beginning with posterior dissection.  The main trunk of the facial nerve was then dissected, the superior and inferior divisions of the facial nerve were then followed from proximal to distal identified preserving each of the nerve branches.  The NIMS monitor was used throughout this component of the surgical procedure.  The parotid mass was dissected free from the surrounding tissue using harmonic scalpel and bipolar cautery.  The parotid specimen was then completely resected and sent to pathology for gross microscopic evaluation.  The facial nerve was then stimulated using the NIMS probe and all branches stimulated appropriately at 0.5 mV.  The parotid bed was then irrigated, hemostasis was maintained with bipolar cautery.  A 7 French round drain was then sutured to the skin with 4-0 Ethilon suture and placed in the parotid bed through the parotid skin incision.  The patient's incision was then closed in multiple layers beginning with reapproximation of the periparotid fascia using interrupted 4-0 Vicryl.  The immediate subcutaneous tissue was  closed with interrupted  5-0 Vicryl suture.  The final skin closure was obtained with Dermabond surgical glue.  An orogastric tube was passed and stomach contents were aspirated. Patient was awakened from anesthetic and transferred from the operating room to the recovery room in stable condition. There were no complications and blood loss was 50 cc.   Delsa Bern, M.D. [facility]

## 2021-02-26 NOTE — H&P (Signed)
Claudia Robinson is an 70 y.o. female.   Chief Complaint: Right parotid mass HPI: Hx of gradually enlarging parotid mass  Past Medical History:  Diagnosis Date  . Anemia    as a teenage and a young woman  . Anxiety   . Cancer (HCC)    basal cell and squamous cell  . Chronic constipation   . Diverticulosis   . Esophageal stricture   . GERD (gastroesophageal reflux disease)   . High cholesterol     Past Surgical History:  Procedure Laterality Date  . BACK SURGERY     lumbar fusion  . BREAST EXCISIONAL BIOPSY Left    benign years ago  . COLONOSCOPY    . MOHS SURGERY     x 3    History reviewed. No pertinent family history. Social History:  reports that she has never smoked. She has never used smokeless tobacco. She reports current alcohol use. She reports that she does not use drugs.  Allergies:  Allergies  Allergen Reactions  . Ciprofloxacin Nausea And Vomiting  . Doxycycline Hives    Medications Prior to Admission  Medication Sig Dispense Refill  . atorvastatin (LIPITOR) 10 MG tablet Take 10 mg by mouth every Monday, Wednesday, and Friday.    . Calcium Carb-Cholecalciferol (CALCIUM-VITAMIN D) 600-400 MG-UNIT TABS Take 1 tablet by mouth daily.    . Cholecalciferol 50 MCG (2000 UT) CAPS Take 2,000 Units by mouth every evening.    . Coenzyme Q10 (COQ10) 200 MG CAPS Take 200 mg by mouth every evening.    Marland Kitchen EFFEXOR XR 75 MG 24 hr capsule Take 150 mg by mouth daily with breakfast.    . famotidine (PEPCID) 20 MG tablet Take 40 mg by mouth daily as needed for heartburn.    . Multiple Vitamin (MULTIVITAMIN WITH MINERALS) TABS tablet Take 1 tablet by mouth every evening.    . Multiple Vitamins-Minerals (PRESERVISION AREDS 2+MULTI VIT PO) Take 1 capsule by mouth daily.    . Omega-3 1000 MG CAPS Take 1,000 mg by mouth daily.    Marland Kitchen omeprazole (PRILOSEC) 40 MG capsule Take 40 mg by mouth daily.    Vladimir Faster Glycol-Propyl Glycol (SYSTANE OP) Apply 1 drop to eye every morning.       Results for orders placed or performed during the hospital encounter of 02/26/21 (from the past 48 hour(s))  CBC per protocol     Status: Abnormal   Collection Time: 02/26/21  7:03 AM  Result Value Ref Range   WBC 6.8 4.0 - 10.5 K/uL   RBC 3.73 (L) 3.87 - 5.11 MIL/uL   Hemoglobin 12.5 12.0 - 15.0 g/dL   HCT 36.3 36.0 - 46.0 %   MCV 97.3 80.0 - 100.0 fL   MCH 33.5 26.0 - 34.0 pg   MCHC 34.4 30.0 - 36.0 g/dL   RDW 13.0 11.5 - 15.5 %   Platelets 292 150 - 400 K/uL   nRBC 0.0 0.0 - 0.2 %    Comment: Performed at [facility], [place] [address] [place], [place] [address]   No results found.  Review of Systems  Constitutional: Negative.   HENT: Positive for facial swelling.   Respiratory: Negative.   Cardiovascular: Negative.     Blood pressure (!) 168/98, pulse 92, temperature 98.1 F (36.7 C), temperature source Oral, resp. rate 18, height 5' (1.524 m), weight 60.3 kg, SpO2 99 %. Physical Exam Constitutional:      Appearance: She is normal weight.  Neck:  Comments: Right parotid mass Cardiovascular:     Rate and Rhythm: Normal rate.     Pulses: Normal pulses.  Pulmonary:     Effort: Pulmonary effort is normal.  Musculoskeletal:     Cervical back: Normal range of motion.  Neurological:     Mental Status: She is alert.      Assessment/Plan Adm for Right parotidectomy with NIMS under GA with O/N obs  Jerrell Belfast, MD 02/26/2021, 8:24 AM

## 2021-02-26 NOTE — Transfer of Care (Signed)
Immediate Anesthesia Transfer of Care Note  Patient: Claudia Robinson  Procedure(s) Performed: RIGHT SUPERFICIAL PAROTIDECTOMY (Right )  Patient Location: PACU  Anesthesia Type:General  Level of Consciousness: awake and alert   Airway & Oxygen Therapy: Patient Spontanous Breathing and Patient connected to face mask oxygen  Post-op Assessment: Report given to RN and Post -op Vital signs reviewed and stable  Post vital signs: Reviewed and stable  Last Vitals:  Vitals Value Taken Time  BP 160/95 02/26/21 1055  Temp    Pulse 102 02/26/21 1056  Resp 14 02/26/21 1056  SpO2 97 % 02/26/21 1056  Vitals shown include unvalidated device data.  Last Pain:  Vitals:   02/26/21 0723  TempSrc: Oral  PainSc:       Patients Stated Pain Goal: 2 (49/44/96 7591)  Complications: No complications documented.

## 2021-02-26 NOTE — Progress Notes (Signed)
   ENT Progress Note:  s/p Procedure(s): RIGHT Total PAROTIDECTOMY   Subjective: C/O pain   Objective: Vital signs in last 24 hours: Temp:  [97.4 F (36.3 C)-98.6 F (37 C)] 97.4 F (36.3 C) (05/11 1203) Pulse Rate:  [84-95] 93 (05/11 1203) Resp:  [10-18] 13 (05/11 1203) BP: (129-168)/(62-98) 136/70 (05/11 1203) SpO2:  [97 %-100 %] 100 % (05/11 1203) Weight:  [60.3 kg] 60.3 kg (05/11 0723) Weight change:     Intake/Output from previous day: No intake/output data recorded. Intake/Output this shift: Total I/O In: 800 [I.V.:800] Out: 25 [Blood:25]  Labs: Recent Labs    02/26/21 0703  WBC 6.8  HGB 12.5  HCT 36.3  PLT 292   Recent Labs    02/26/21 0703  NA 134*  K 4.2  CL 101  CO2 27  GLUCOSE 91  BUN 11  CALCIUM 9.0    Studies/Results: No results found.   PHYSICAL EXAM: Inc intact, no swelling or erythema JP functional Facial nerve intact   Assessment/Plan: Stable postop Cont pain meds and po as tolerated Plan d/c in am    Jerrell Belfast 02/26/2021, 5:19 PM

## 2021-02-26 NOTE — Anesthesia Procedure Notes (Signed)
Procedure Name: Intubation Date/Time: 02/26/2021 8:41 AM Performed by: Glynda Jaeger, CRNA Pre-anesthesia Checklist: Patient identified, Patient being monitored, Timeout performed, Emergency Drugs available and Suction available Patient Re-evaluated:Patient Re-evaluated prior to induction Oxygen Delivery Method: Circle System Utilized Preoxygenation: Pre-oxygenation with 100% oxygen Induction Type: IV induction Ventilation: Mask ventilation without difficulty Laryngoscope Size: Mac and 4 Grade View: Grade II Tube type: Oral Tube size: 7.5 mm Number of attempts: 1 Airway Equipment and Method: Stylet Placement Confirmation: ETT inserted through vocal cords under direct vision,  positive ETCO2 and breath sounds checked- equal and bilateral Secured at: 21 cm Tube secured with: Tape Dental Injury: Teeth and Oropharynx as per pre-operative assessment

## 2021-02-26 NOTE — Anesthesia Preprocedure Evaluation (Addendum)
Anesthesia Evaluation  Patient identified by MRN, date of birth, ID band Patient awake    Reviewed: Allergy & Precautions, NPO status , Patient's Chart, lab work & pertinent test results  Airway Mallampati: III  TM Distance: >3 FB Neck ROM: Full    Dental no notable dental hx.    Pulmonary neg pulmonary ROS,    Pulmonary exam normal breath sounds clear to auscultation       Cardiovascular negative cardio ROS Normal cardiovascular exam Rhythm:Regular Rate:Normal     Neuro/Psych Anxiety negative neurological ROS     GI/Hepatic Neg liver ROS, GERD  Medicated and Controlled,  Endo/Other  negative endocrine ROS  Renal/GU negative Renal ROS     Musculoskeletal negative musculoskeletal ROS (+)   Abdominal   Peds  Hematology HLD   Anesthesia Other Findings Mass of Parotid Gland  Reproductive/Obstetrics                           Anesthesia Physical Anesthesia Plan  ASA: II  Anesthesia Plan: General   Post-op Pain Management:    Induction: Intravenous  PONV Risk Score and Plan: 4 or greater and Ondansetron, Dexamethasone, Midazolam, Treatment may vary due to age or medical condition and Propofol infusion  Airway Management Planned: Oral ETT  Additional Equipment:   Intra-op Plan:   Post-operative Plan: Extubation in OR  Informed Consent: I have reviewed the patients History and Physical, chart, labs and discussed the procedure including the risks, benefits and alternatives for the proposed anesthesia with the patient or authorized representative who has indicated his/her understanding and acceptance.     Dental advisory given  Plan Discussed with: CRNA  Anesthesia Plan Comments:        Anesthesia Quick Evaluation

## 2021-02-26 NOTE — Discharge Instructions (Signed)
1. Limited activity 2. Liquid and soft diet, advance as tolerated 3. May bathe and shower day after surgery 4. Wound care - Gentle cleaning with soap and water 5. DO NOT APPLY ANY OINTMENT 6. Elevate Head of Bed 7.  Alternate Tylenol and Motrin every 6 hours as needed for discomfort.  Please contact [facility] ENT [phone]) for any additional concerns.

## 2021-02-26 NOTE — Anesthesia Postprocedure Evaluation (Signed)
Anesthesia Post Note  Patient: Claudia Robinson  Procedure(s) Performed: RIGHT SUPERFICIAL PAROTIDECTOMY (Right )     Patient location during evaluation: PACU Anesthesia Type: General Level of consciousness: awake Pain management: pain level controlled Vital Signs Assessment: post-procedure vital signs reviewed and stable Respiratory status: spontaneous breathing, nonlabored ventilation, respiratory function stable and patient connected to nasal cannula oxygen Cardiovascular status: blood pressure returned to baseline and stable Postop Assessment: no apparent nausea or vomiting Anesthetic complications: no   No complications documented.  Last Vitals:  Vitals:   02/26/21 1140 02/26/21 1203  BP: 129/76 136/70  Pulse: 84 93  Resp: 10 13  Temp: (!) 36.3 C (!) 36.3 C  SpO2: 100% 100%    Last Pain:  Vitals:   02/26/21 1203  TempSrc: Oral  PainSc:                  Lennon Boutwell P Tovia Kisner

## 2021-02-27 ENCOUNTER — Encounter (HOSPITAL_COMMUNITY): Payer: Self-pay | Admitting: Otolaryngology

## 2021-02-27 DIAGNOSIS — D110 Benign neoplasm of parotid gland: Secondary | ICD-10-CM | POA: Diagnosis not present

## 2021-02-27 DIAGNOSIS — Z85828 Personal history of other malignant neoplasm of skin: Secondary | ICD-10-CM | POA: Diagnosis not present

## 2021-02-27 NOTE — Discharge Summary (Signed)
Physician Discharge Summary  Patient ID: Claudia Robinson MRN: [id number] DOB/AGE: 70-Jan-1952 36 y.o.  Admit date: 02/26/2021 Discharge date: 02/27/2021  Admission Diagnoses: Parotid mass  Discharge Diagnoses:  Principal Problem:   Mass of right parotid gland Active Problems:   Parotid mass   Discharged Condition: good  Hospital Course: No complications  Consults: none  Significant Diagnostic Studies: none  Treatments: surgery: Right superficial parotidectomy with facial nerve dissection  Discharge Exam: Blood pressure (!) 144/73, pulse 73, temperature 98.5 F (36.9 C), temperature source Oral, resp. rate 16, height 5' (1.524 m), weight 60.3 kg, SpO2 94 %. PHYSICAL EXAM: Awake and alert.  Breathing and voice are clear.  Very slight weakness of the buccal branch of the facial nerve otherwise all intact.  Incision looks excellent.  JP drain removed.  Disposition: Discharge disposition: 01-Home or Self Care       Discharge Instructions    Diet - low sodium heart healthy   Complete by: As directed    Diet - low sodium heart healthy   Complete by: As directed    Discharge wound care:   Complete by: As directed    Keep a small dressing on the drain site until there is no more drainage.  It is okay to use soap water and shampoo but do not use any creams or oils or ointments on the glue.   Increase activity slowly   Complete by: As directed    Increase activity slowly   Complete by: As directed    No wound care   Complete by: As directed      Allergies as of 02/27/2021      Reactions   Ciprofloxacin Nausea And Vomiting   Doxycycline Hives      Medication List    TAKE these medications   atorvastatin 10 MG tablet Commonly known as: LIPITOR Take 10 mg by mouth every Monday, Wednesday, and Friday.   Calcium-Vitamin D 600-400 MG-UNIT Tabs Take 1 tablet by mouth daily.   Cholecalciferol 50 MCG (2000 UT) Caps Take 2,000 Units by mouth every evening.   CoQ10  200 MG Caps Take 200 mg by mouth every evening.   Effexor XR 75 MG 24 hr capsule Generic drug: venlafaxine XR Take 150 mg by mouth daily with breakfast.   famotidine 20 MG tablet Commonly known as: PEPCID Take 40 mg by mouth daily as needed for heartburn.   HYDROcodone-acetaminophen 5-325 MG tablet Commonly known as: NORCO/VICODIN 1 - 2 po q 12 hrs prn postop pain, alternate every 6 hours with Motrin 600 mg   multivitamin with minerals Tabs tablet Take 1 tablet by mouth every evening.   Omega-3 1000 MG Caps Take 1,000 mg by mouth daily.   omeprazole 40 MG capsule Commonly known as: PRILOSEC Take 40 mg by mouth daily.   ondansetron 4 MG disintegrating tablet Commonly known as: Zofran ODT Take 1 tablet (4 mg total) by mouth every 8 (eight) hours as needed for nausea or vomiting.   PRESERVISION AREDS 2+MULTI VIT PO Take 1 capsule by mouth daily.   SYSTANE OP Apply 1 drop to eye every morning.            Discharge Care Instructions  (From admission, onward)         Start     Ordered   02/27/21 0000  Discharge wound care:       Comments: Keep a small dressing on the drain site until there is no more drainage.  It is  okay to use soap water and shampoo but do not use any creams or oils or ointments on the glue.   02/27/21 1660          Follow-up Information    Jerrell Belfast, MD In 2 weeks.   Specialty: Otolaryngology Contact information: [address] [place] [address] [phone]               Signed: Izora Gala 02/27/2021, 8:52 AM

## 2021-02-27 NOTE — Progress Notes (Signed)
IV removed. JP drain removed by provider. Discharge instructions given to patient. Patient verbalizes understanding. Patient discharged

## 2021-02-28 LAB — SURGICAL PATHOLOGY

## 2021-03-20 ENCOUNTER — Ambulatory Visit: Payer: BC Managed Care – PPO | Attending: Internal Medicine

## 2021-03-20 ENCOUNTER — Other Ambulatory Visit (HOSPITAL_BASED_OUTPATIENT_CLINIC_OR_DEPARTMENT_OTHER): Payer: Self-pay

## 2021-03-20 ENCOUNTER — Other Ambulatory Visit: Payer: Self-pay

## 2021-03-20 DIAGNOSIS — Z23 Encounter for immunization: Secondary | ICD-10-CM

## 2021-03-20 MED ORDER — PFIZER-BIONT COVID-19 VAC-TRIS 30 MCG/0.3ML IM SUSP
INTRAMUSCULAR | 0 refills | Status: AC
  Filled 2021-03-20: qty 0.3, 1d supply, fill #0

## 2021-03-20 NOTE — Progress Notes (Signed)
   Covid-19 Vaccination Clinic  Name:  ARTIS BUECHELE    MRN: [id number] DOB: [birth year]  03/20/2021  Ms. Vien was observed post Covid-19 immunization for 15 minutes without incident. She was provided with Vaccine Information Sheet and instruction to access the V-Safe system.   Ms. Schneck was instructed to call 911 with any severe reactions post vaccine: Marland Kitchen Difficulty breathing  . Swelling of face and throat  . A fast heartbeat  . A bad rash all over body  . Dizziness and weakness   Immunizations Administered    Name Date Dose VIS Date Route   PFIZER Comrnaty(Gray TOP) Covid-19 Vaccine 03/20/2021  2:05 PM 0.3 mL 09/26/2020 Intramuscular   Manufacturer: [place]   Lot: [id number]   [place]: [id number]

## 2021-04-16 DIAGNOSIS — B351 Tinea unguium: Secondary | ICD-10-CM | POA: Diagnosis not present

## 2021-04-16 DIAGNOSIS — L821 Other seborrheic keratosis: Secondary | ICD-10-CM | POA: Diagnosis not present

## 2021-04-16 DIAGNOSIS — D692 Other nonthrombocytopenic purpura: Secondary | ICD-10-CM | POA: Diagnosis not present

## 2021-04-16 DIAGNOSIS — Z85828 Personal history of other malignant neoplasm of skin: Secondary | ICD-10-CM | POA: Diagnosis not present

## 2021-05-21 ENCOUNTER — Other Ambulatory Visit: Payer: Self-pay | Admitting: Internal Medicine

## 2021-05-21 DIAGNOSIS — E7800 Pure hypercholesterolemia, unspecified: Secondary | ICD-10-CM

## 2021-05-21 DIAGNOSIS — E559 Vitamin D deficiency, unspecified: Secondary | ICD-10-CM | POA: Diagnosis not present

## 2021-05-21 DIAGNOSIS — K222 Esophageal obstruction: Secondary | ICD-10-CM | POA: Diagnosis not present

## 2021-05-21 DIAGNOSIS — R030 Elevated blood-pressure reading, without diagnosis of hypertension: Secondary | ICD-10-CM | POA: Diagnosis not present

## 2021-06-09 ENCOUNTER — Inpatient Hospital Stay: Admission: RE | Admit: 2021-06-09 | Payer: BC Managed Care – PPO | Source: Ambulatory Visit

## 2021-06-27 DIAGNOSIS — H35371 Puckering of macula, right eye: Secondary | ICD-10-CM | POA: Diagnosis not present

## 2021-06-27 DIAGNOSIS — H2513 Age-related nuclear cataract, bilateral: Secondary | ICD-10-CM | POA: Diagnosis not present

## 2021-07-10 DIAGNOSIS — H2511 Age-related nuclear cataract, right eye: Secondary | ICD-10-CM | POA: Diagnosis not present

## 2021-07-10 DIAGNOSIS — H25811 Combined forms of age-related cataract, right eye: Secondary | ICD-10-CM | POA: Diagnosis not present

## 2021-07-12 ENCOUNTER — Emergency Department (HOSPITAL_COMMUNITY): Payer: BC Managed Care – PPO

## 2021-07-12 ENCOUNTER — Other Ambulatory Visit: Payer: Self-pay

## 2021-07-12 ENCOUNTER — Emergency Department (HOSPITAL_COMMUNITY)
Admission: EM | Admit: 2021-07-12 | Discharge: 2021-07-13 | Disposition: A | Discharge status: Home / Self Care | Payer: BC Managed Care – PPO | Attending: Emergency Medicine | Admitting: Emergency Medicine

## 2021-07-12 ENCOUNTER — Encounter (HOSPITAL_COMMUNITY): Payer: Self-pay

## 2021-07-12 DIAGNOSIS — S0990XA Unspecified injury of head, initial encounter: Secondary | ICD-10-CM | POA: Insufficient documentation

## 2021-07-12 DIAGNOSIS — W01198A Fall on same level from slipping, tripping and stumbling with subsequent striking against other object, initial encounter: Secondary | ICD-10-CM | POA: Diagnosis not present

## 2021-07-12 DIAGNOSIS — S0003XA Contusion of scalp, initial encounter: Secondary | ICD-10-CM | POA: Diagnosis not present

## 2021-07-12 DIAGNOSIS — Z5321 Procedure and treatment not carried out due to patient leaving prior to being seen by health care provider: Secondary | ICD-10-CM | POA: Insufficient documentation

## 2021-07-12 NOTE — ED Triage Notes (Addendum)
Pt in for dizziness. Pt states she had cataract surgery this past Thursday. Patient did state she take abx eye drops, cipro, and after taking the drops, patient got dizzy. Pt then tried to ambulate, fell and hit head on dresser. Pt states she did not pass out and is not on blood thinners. Pt does present with goose egg on posterior head on right. Pt not endorsing any unilateral weakness or does not present with any stroke like symptoms.

## 2021-07-13 NOTE — ED Notes (Signed)
Pt left AMA °

## 2021-07-15 DIAGNOSIS — R55 Syncope and collapse: Secondary | ICD-10-CM | POA: Diagnosis not present

## 2021-07-15 DIAGNOSIS — S0990XA Unspecified injury of head, initial encounter: Secondary | ICD-10-CM | POA: Diagnosis not present

## 2021-07-16 ENCOUNTER — Other Ambulatory Visit (HOSPITAL_BASED_OUTPATIENT_CLINIC_OR_DEPARTMENT_OTHER): Payer: Self-pay

## 2021-07-16 ENCOUNTER — Ambulatory Visit: Payer: BC Managed Care – PPO | Attending: Internal Medicine

## 2021-07-16 DIAGNOSIS — Z23 Encounter for immunization: Secondary | ICD-10-CM

## 2021-07-16 MED ORDER — PFIZER COVID-19 VAC BIVALENT 30 MCG/0.3ML IM SUSP
INTRAMUSCULAR | 0 refills | Status: AC
  Filled 2021-07-16: qty 0.3, 1d supply, fill #0

## 2021-07-16 NOTE — Progress Notes (Signed)
   Covid-19 Vaccination Clinic  Name:  LILLYEN SCHOW    MRN: [id number] DOB: 1951/03/04  07/16/2021  Ms. Harnden was observed post Covid-19 immunization for 15 minutes without incident. She was provided with Vaccine Information Sheet and instruction to access the V-Safe system.   Ms. Dunleavy was instructed to call 911 with any severe reactions post vaccine: Difficulty breathing  Swelling of face and throat  A fast heartbeat  A bad rash all over body  Dizziness and weakness

## 2021-07-18 DIAGNOSIS — Z23 Encounter for immunization: Secondary | ICD-10-CM | POA: Diagnosis not present

## 2021-08-21 DIAGNOSIS — H2512 Age-related nuclear cataract, left eye: Secondary | ICD-10-CM | POA: Diagnosis not present

## 2021-08-21 DIAGNOSIS — H25812 Combined forms of age-related cataract, left eye: Secondary | ICD-10-CM | POA: Diagnosis not present

## 2021-09-04 ENCOUNTER — Other Ambulatory Visit: Payer: Self-pay | Admitting: Internal Medicine

## 2021-09-04 DIAGNOSIS — E7800 Pure hypercholesterolemia, unspecified: Secondary | ICD-10-CM

## 2021-09-26 DIAGNOSIS — H02054 Trichiasis without entropian left upper eyelid: Secondary | ICD-10-CM | POA: Diagnosis not present

## 2021-09-29 ENCOUNTER — Ambulatory Visit
Admission: RE | Admit: 2021-09-29 | Discharge: 2021-09-29 | Disposition: A | Discharge status: Home / Self Care | Payer: No Typology Code available for payment source | Source: Ambulatory Visit | Attending: Internal Medicine | Admitting: Internal Medicine

## 2021-09-29 DIAGNOSIS — Z8249 Family history of ischemic heart disease and other diseases of the circulatory system: Secondary | ICD-10-CM | POA: Diagnosis not present

## 2021-09-29 DIAGNOSIS — E785 Hyperlipidemia, unspecified: Secondary | ICD-10-CM | POA: Diagnosis not present

## 2021-09-29 DIAGNOSIS — E7800 Pure hypercholesterolemia, unspecified: Secondary | ICD-10-CM

## 2021-10-03 DIAGNOSIS — H35411 Lattice degeneration of retina, right eye: Secondary | ICD-10-CM | POA: Diagnosis not present

## 2021-10-03 DIAGNOSIS — H33312 Horseshoe tear of retina without detachment, left eye: Secondary | ICD-10-CM | POA: Diagnosis not present

## 2021-10-03 DIAGNOSIS — H35371 Puckering of macula, right eye: Secondary | ICD-10-CM | POA: Diagnosis not present

## 2021-10-03 DIAGNOSIS — H33302 Unspecified retinal break, left eye: Secondary | ICD-10-CM | POA: Diagnosis not present

## 2021-10-03 DIAGNOSIS — H43811 Vitreous degeneration, right eye: Secondary | ICD-10-CM | POA: Diagnosis not present

## 2021-10-03 DIAGNOSIS — H4312 Vitreous hemorrhage, left eye: Secondary | ICD-10-CM | POA: Diagnosis not present

## 2021-10-06 DIAGNOSIS — H33312 Horseshoe tear of retina without detachment, left eye: Secondary | ICD-10-CM | POA: Diagnosis not present

## 2021-10-06 DIAGNOSIS — H4312 Vitreous hemorrhage, left eye: Secondary | ICD-10-CM | POA: Diagnosis not present

## 2021-10-07 DIAGNOSIS — H33312 Horseshoe tear of retina without detachment, left eye: Secondary | ICD-10-CM | POA: Diagnosis not present

## 2021-10-09 DIAGNOSIS — Z03818 Encounter for observation for suspected exposure to other biological agents ruled out: Secondary | ICD-10-CM | POA: Diagnosis not present

## 2021-10-13 IMAGING — CT CT NECK W/ CM
5 of 6 series · 14 of 33 positions shown, 16 images · IV contrast (iopamidol)
Comparison: 03/01/2019

CLINICAL DATA: Right parotid mass

Creatinine was obtained on site at [HOSPITAL] at [REDACTED].
Results: Creatinine 0.8 mg/dL.
EXAM:
CT NECK WITH CONTRAST
TECHNIQUE: Multidetector CT imaging of the neck was performed using the
standard protocol following the bolus administration of intravenous
contrast.
CONTRAST:  75mL [id number] IOPAMIDOL (KXVW4M-A88) INJECTION 61%

[Series 2: neck 2.00 br40 s3 st/ no angle · axial · 0.40mm/px · z∈[-759,-681]mm · 2 of 119 slices shown, 3 images]
[im 40/119  soft-tissue]
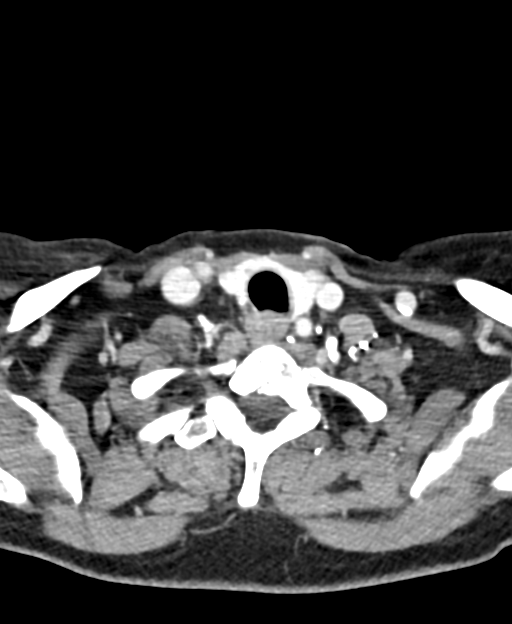
[im 40/119  bone]
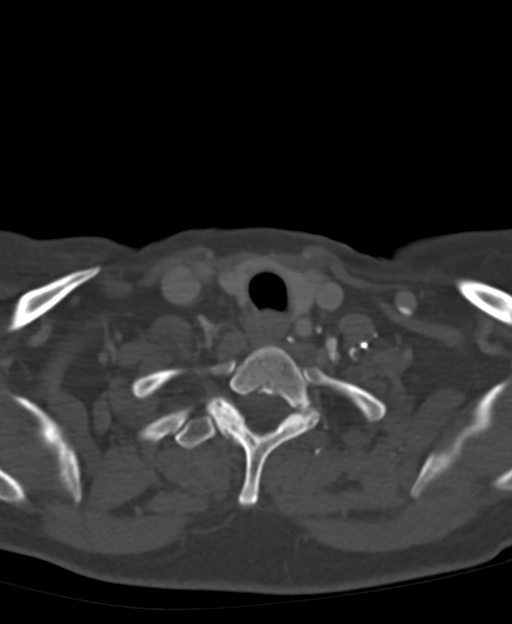
[im 79/119  bone]
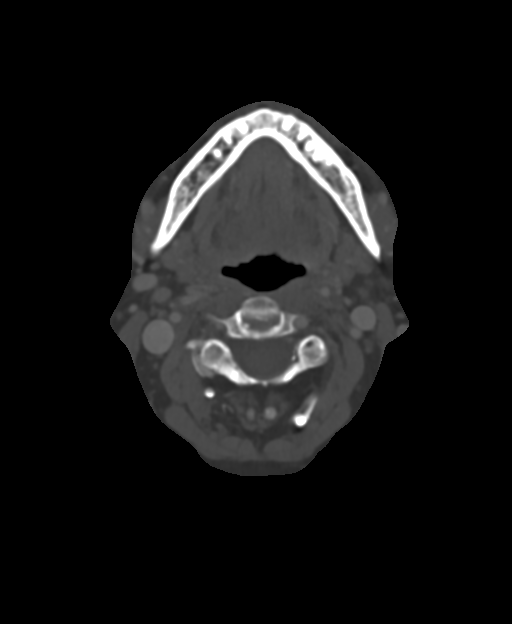

[Series 4: neck 2.00 br60 s3 bone/ no angle · axial · 0.40mm/px · z∈[-759,-681]mm · 2 of 119 slices shown]
[im 40/119  bone]
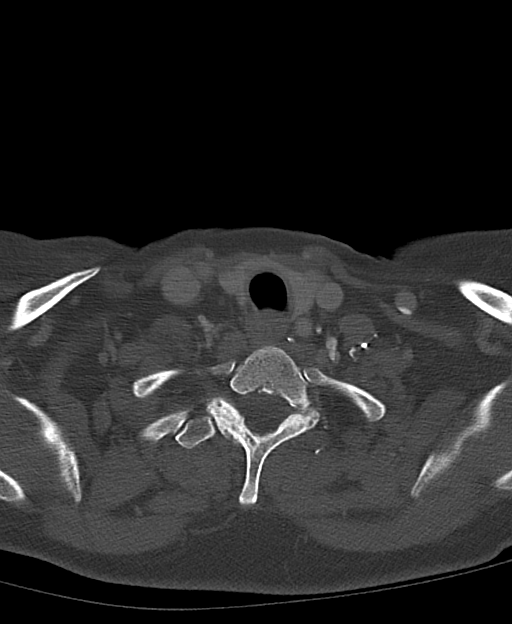
[im 79/119  bone]
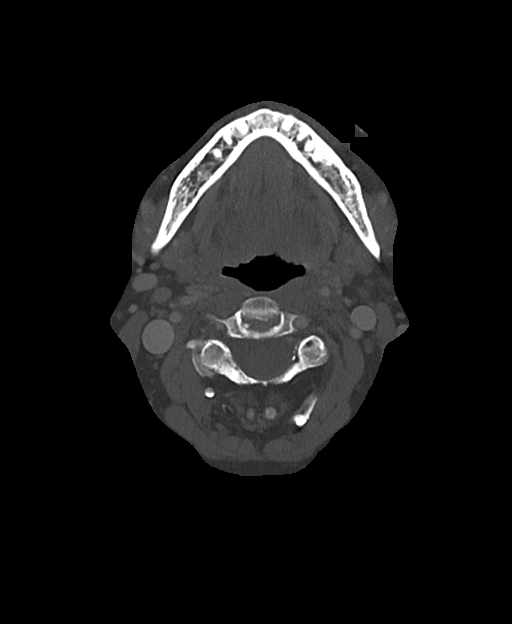

[Series 6: neck 2.00 br36 s3 angled axial (person_name) · axial · 0.40mm/px · z∈[-773,-696]mm · 2 of 119 slices shown]
[im 40/119  bone]
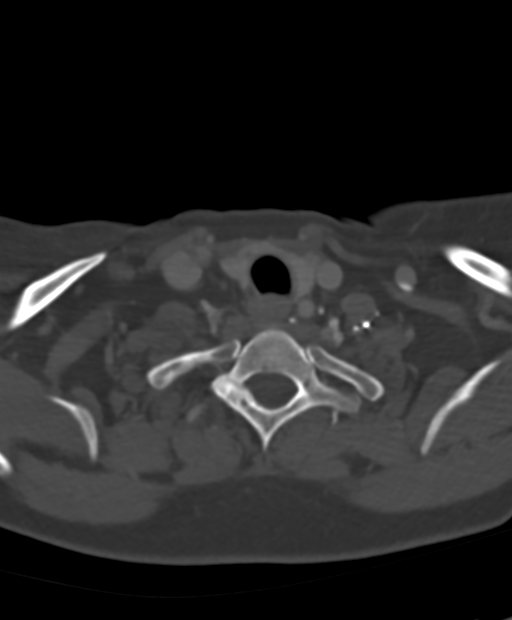
[im 79/119  bone]
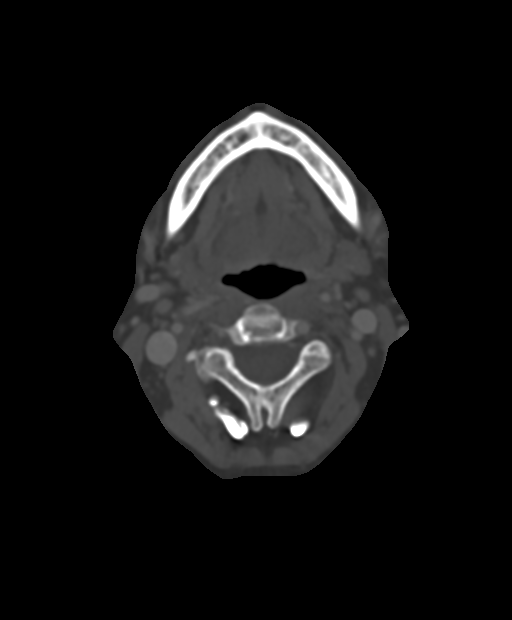

[Series 10: neck 2.00 br40 s3 (person_name) · coronal · 0.39mm/px · 3 of 130 slices shown (1 of 2)]
[im 26/130  bone]
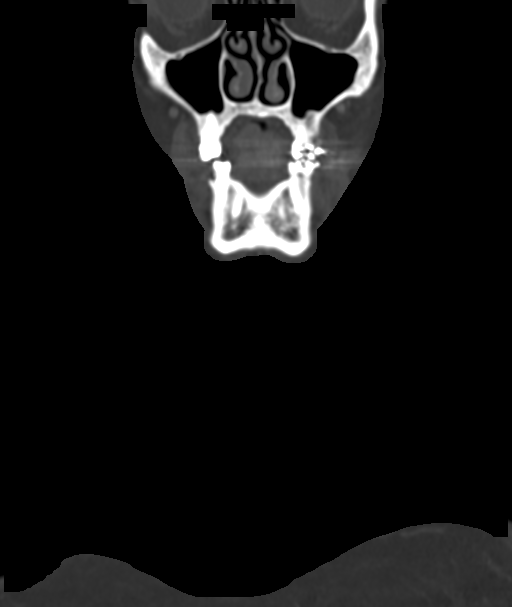
[im 52/130  bone]
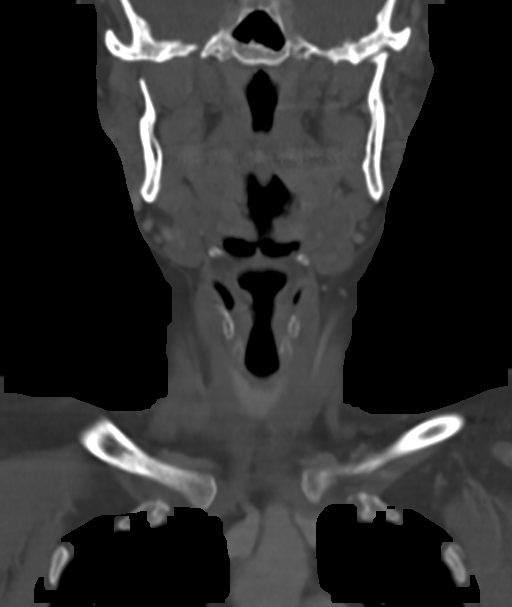
[im 78/130  bone]
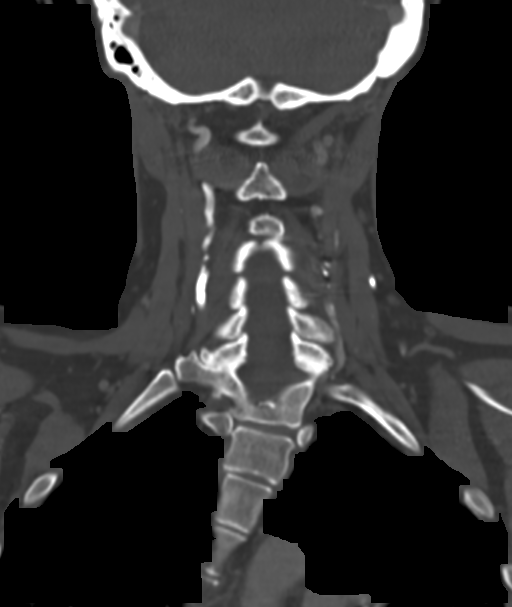

[Series 12: neck 2.00 br40 s3 (person_name) · sagittal · 0.47mm/px · 5 of 105 slices shown, 6 images (2 of 2)]
[im 35/105  bone]
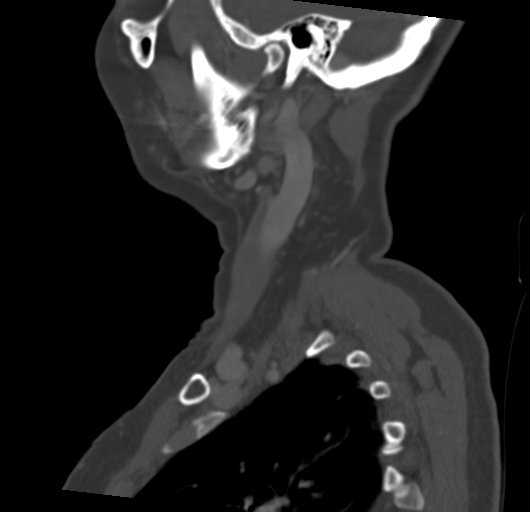
[im 44/105  bone]
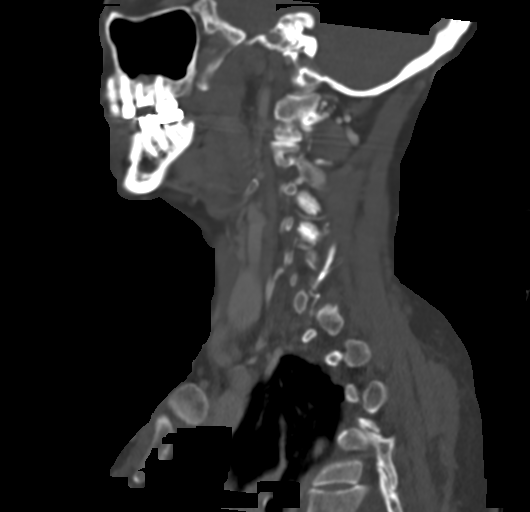
[im 53/105  soft-tissue]
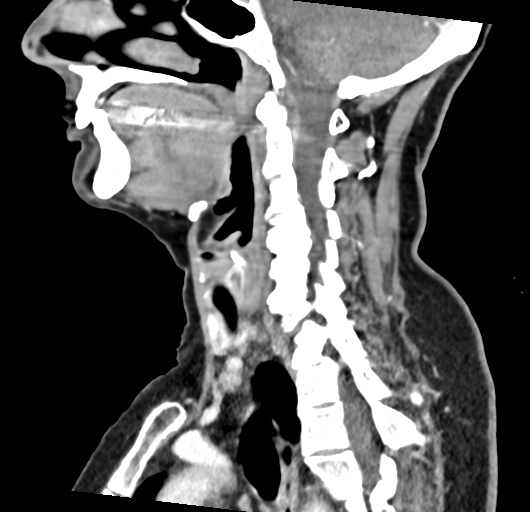
[im 53/105  bone]
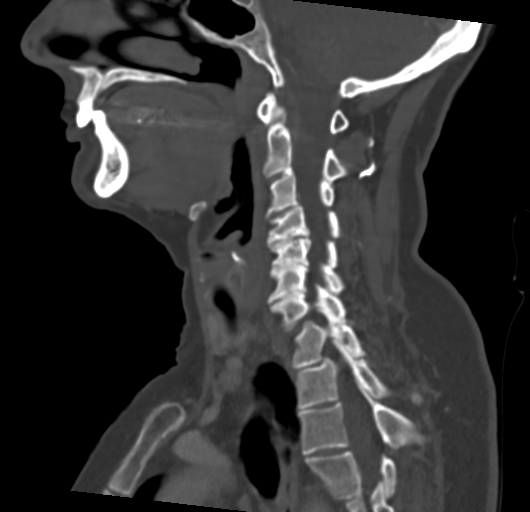
[im 61/105  bone]
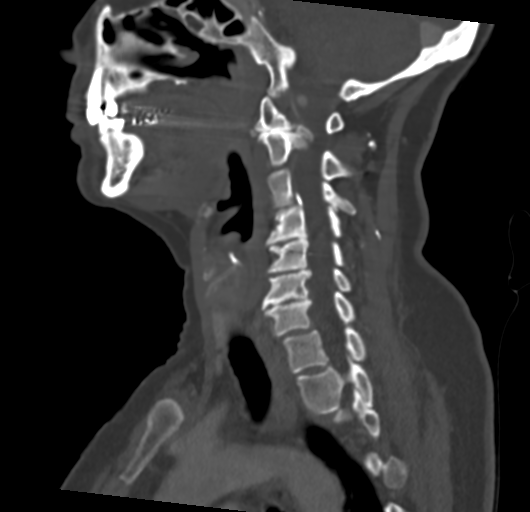
[im 70/105  bone]
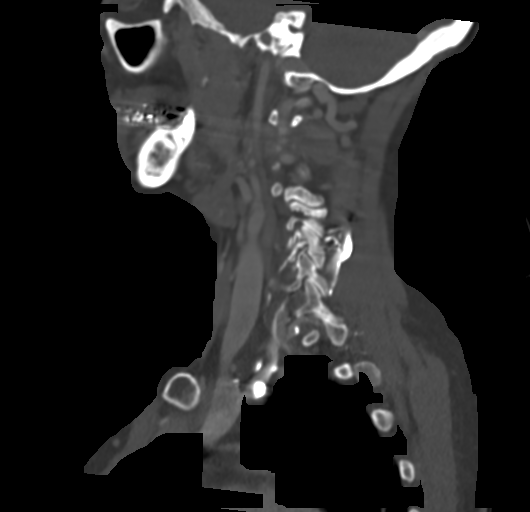

[14 of 33 positions shown; findings below may reference images not displayed]

FINDINGS: Pharynx and larynx: No mucosal or submucosal lesion.

Salivary glands: Submandibular glands are normal. Left parotid gland
is normal. Right parotid gland contains a hyperdense/enhancing mass
in the superficial lobe which has enlarged since the previous study.
Today, the lesion measures 25 x 16 x 29 mm. Previously, this
measured 22 x 14 x 24 mm.

Thyroid: Cystic and solid nodule on the left measuring 10 mm,
unchanged.

Lymph nodes: No enlarged or abnormal density nodes on either side of
the neck.

Vascular: Negative

Limited intracranial: Normal

Visualized orbits: Normal

Mastoids and visualized paranasal sinuses: Clear

Skeleton: Cervical spondylosis, unchanged since the previous study.
Scoliosis.

Upper chest: Upper lobe emphysematous changes.

Other: None
IMPRESSION: Enlargement of the solid hyperdense/enhancing mass of the
superficial lobe of the right parotid gland since the previous study
1 month ago. 25 x 16 x 29 mm today compared with 22 x 14 x 24 mm
previously. No adenopathy.

1 cm cystic and solid nodule of the left thyroid. No followup
recommended (ref: [HOSPITAL]. [DATE]): 143-50).

Emphysema ([id number].J).

## 2021-10-14 DIAGNOSIS — H33312 Horseshoe tear of retina without detachment, left eye: Secondary | ICD-10-CM | POA: Diagnosis not present

## 2021-10-14 DIAGNOSIS — H31092 Other chorioretinal scars, left eye: Secondary | ICD-10-CM | POA: Diagnosis not present

## 2021-10-14 DIAGNOSIS — H4312 Vitreous hemorrhage, left eye: Secondary | ICD-10-CM | POA: Diagnosis not present

## 2021-10-24 DIAGNOSIS — H31092 Other chorioretinal scars, left eye: Secondary | ICD-10-CM | POA: Diagnosis not present

## 2021-10-24 DIAGNOSIS — H4312 Vitreous hemorrhage, left eye: Secondary | ICD-10-CM | POA: Diagnosis not present

## 2021-10-24 DIAGNOSIS — H33312 Horseshoe tear of retina without detachment, left eye: Secondary | ICD-10-CM | POA: Diagnosis not present

## 2021-11-05 DIAGNOSIS — H31092 Other chorioretinal scars, left eye: Secondary | ICD-10-CM | POA: Diagnosis not present

## 2021-11-05 DIAGNOSIS — H33312 Horseshoe tear of retina without detachment, left eye: Secondary | ICD-10-CM | POA: Diagnosis not present

## 2021-11-11 DIAGNOSIS — H33321 Round hole, right eye: Secondary | ICD-10-CM | POA: Diagnosis not present

## 2021-11-24 DIAGNOSIS — R1310 Dysphagia, unspecified: Secondary | ICD-10-CM | POA: Diagnosis not present

## 2021-11-24 DIAGNOSIS — K222 Esophageal obstruction: Secondary | ICD-10-CM | POA: Diagnosis not present

## 2021-11-24 DIAGNOSIS — Z86010 Personal history of colonic polyps: Secondary | ICD-10-CM | POA: Diagnosis not present

## 2021-11-24 DIAGNOSIS — K649 Unspecified hemorrhoids: Secondary | ICD-10-CM | POA: Diagnosis not present

## 2021-11-24 DIAGNOSIS — Z800 Family history of malignant neoplasm of digestive organs: Secondary | ICD-10-CM | POA: Diagnosis not present

## 2021-11-24 DIAGNOSIS — K449 Diaphragmatic hernia without obstruction or gangrene: Secondary | ICD-10-CM | POA: Diagnosis not present

## 2021-11-24 DIAGNOSIS — D124 Benign neoplasm of descending colon: Secondary | ICD-10-CM | POA: Diagnosis not present

## 2021-11-24 DIAGNOSIS — K5730 Diverticulosis of large intestine without perforation or abscess without bleeding: Secondary | ICD-10-CM | POA: Diagnosis not present

## 2021-11-24 DIAGNOSIS — K317 Polyp of stomach and duodenum: Secondary | ICD-10-CM | POA: Diagnosis not present

## 2021-11-27 DIAGNOSIS — K222 Esophageal obstruction: Secondary | ICD-10-CM | POA: Diagnosis not present

## 2021-11-27 DIAGNOSIS — Z23 Encounter for immunization: Secondary | ICD-10-CM | POA: Diagnosis not present

## 2021-11-27 DIAGNOSIS — E559 Vitamin D deficiency, unspecified: Secondary | ICD-10-CM | POA: Diagnosis not present

## 2021-11-27 DIAGNOSIS — Z0000 Encounter for general adult medical examination without abnormal findings: Secondary | ICD-10-CM | POA: Diagnosis not present

## 2021-11-27 DIAGNOSIS — F4321 Adjustment disorder with depressed mood: Secondary | ICD-10-CM | POA: Diagnosis not present

## 2021-11-27 DIAGNOSIS — E7800 Pure hypercholesterolemia, unspecified: Secondary | ICD-10-CM | POA: Diagnosis not present

## 2021-11-28 DIAGNOSIS — E7800 Pure hypercholesterolemia, unspecified: Secondary | ICD-10-CM | POA: Diagnosis not present

## 2021-11-28 DIAGNOSIS — Z79899 Other long term (current) drug therapy: Secondary | ICD-10-CM | POA: Diagnosis not present

## 2021-11-28 DIAGNOSIS — E559 Vitamin D deficiency, unspecified: Secondary | ICD-10-CM | POA: Diagnosis not present

## 2021-12-01 DIAGNOSIS — L570 Actinic keratosis: Secondary | ICD-10-CM | POA: Diagnosis not present

## 2021-12-01 DIAGNOSIS — L821 Other seborrheic keratosis: Secondary | ICD-10-CM | POA: Diagnosis not present

## 2021-12-01 DIAGNOSIS — Z85828 Personal history of other malignant neoplasm of skin: Secondary | ICD-10-CM | POA: Diagnosis not present

## 2021-12-01 DIAGNOSIS — L718 Other rosacea: Secondary | ICD-10-CM | POA: Diagnosis not present

## 2021-12-01 DIAGNOSIS — L853 Xerosis cutis: Secondary | ICD-10-CM | POA: Diagnosis not present

## 2021-12-14 ENCOUNTER — Encounter (HOSPITAL_BASED_OUTPATIENT_CLINIC_OR_DEPARTMENT_OTHER): Payer: Self-pay

## 2021-12-14 ENCOUNTER — Other Ambulatory Visit: Payer: Self-pay

## 2021-12-14 ENCOUNTER — Emergency Department (HOSPITAL_BASED_OUTPATIENT_CLINIC_OR_DEPARTMENT_OTHER): Payer: BC Managed Care – PPO | Admitting: Radiology

## 2021-12-14 ENCOUNTER — Emergency Department (HOSPITAL_BASED_OUTPATIENT_CLINIC_OR_DEPARTMENT_OTHER)
Admission: EM | Admit: 2021-12-14 | Discharge: 2021-12-14 | Disposition: A | Discharge status: Home / Self Care | Payer: BC Managed Care – PPO | Attending: Emergency Medicine | Admitting: Emergency Medicine

## 2021-12-14 DIAGNOSIS — W010XXA Fall on same level from slipping, tripping and stumbling without subsequent striking against object, initial encounter: Secondary | ICD-10-CM | POA: Insufficient documentation

## 2021-12-14 DIAGNOSIS — S022XXA Fracture of nasal bones, initial encounter for closed fracture: Secondary | ICD-10-CM | POA: Diagnosis not present

## 2021-12-14 DIAGNOSIS — S0993XA Unspecified injury of face, initial encounter: Secondary | ICD-10-CM | POA: Diagnosis not present

## 2021-12-14 NOTE — ED Triage Notes (Signed)
Pt just returned from out of town, went into bedroom and tripped over suitcase and face-planted.   States her nose did bleed. No bleeding at this time.  Denies any dizziness at this time.

## 2021-12-14 NOTE — ED Notes (Signed)
Patient transported to X-ray 

## 2021-12-14 NOTE — Discharge Instructions (Signed)
Ice for 20 minutes every 2 hours while awake for the next 2 days.  Local wound care with bacitracin and Band-Aid.  Return to the emergency department for any new and/or concerning symptoms.

## 2021-12-14 NOTE — ED Notes (Signed)
Discharge instructions including follow up care and pain management discussed with pt. Pt verbalized understanding with no questions at this time. Pt to go home with s/o at bedside.

## 2021-12-14 NOTE — ED Provider Notes (Signed)
[facility] EMERGENCY DEPT Provider Note   CSN: [id number] Arrival date & time: 12/14/21  2113     History  Chief Complaint  Patient presents with   Facial Injury    Claudia Robinson is a 71 y.o. female.  Patient is a 71 year old female with history of hyperlipidemia, GERD.  Patient presenting for evaluation of fall.  She just returned from being out of town.  She was walking through a dark room in her house when she tripped over the suitcase she had just brought in.  She landed on her nose on the floor.  She has an abrasion to the bridge of the nose and had some bleeding from the left nares.  This was controlled with direct pressure.  She denies any loss of consciousness, neck pain, headache, or other injury in the fall.  The history is provided by the patient.      Home Medications Prior to Admission medications   Medication Sig Start Date End Date Taking? Authorizing Provider  atorvastatin (LIPITOR) 10 MG tablet Take 10 mg by mouth every Monday, Wednesday, and Friday.    [provider]  Calcium Carb-Cholecalciferol (CALCIUM-VITAMIN D) 600-400 MG-UNIT TABS Take 1 tablet by mouth daily.    [provider]  Cholecalciferol 50 MCG (2000 UT) CAPS Take 2,000 Units by mouth every evening.    [provider]  Coenzyme Q10 (COQ10) 200 MG CAPS Take 200 mg by mouth every evening.    [provider]  COVID-19 mRNA bivalent vaccine, Pfizer, (PFIZER COVID-19 VAC BIVALENT) injection Inject into the muscle. 07/16/21   Carlyle Basques, MD  COVID-19 mRNA Vac-TriS, Pfizer, (PFIZER-BIONT COVID-19 VAC-TRIS) SUSP injection Inject into the muscle. 03/20/21   Carlyle Basques, MD  EFFEXOR XR 75 MG 24 hr capsule Take 150 mg by mouth daily with breakfast. 01/16/14   [provider]  famotidine (PEPCID) 20 MG tablet Take 40 mg by mouth daily as needed for heartburn.    [provider]  HYDROcodone-acetaminophen (NORCO/VICODIN) 5-325 MG tablet  1 - 2 po q 12 hrs prn postop pain, alternate every 6 hours with Motrin 600 mg 02/26/21   Jerrell Belfast, MD  Multiple Vitamin (MULTIVITAMIN WITH MINERALS) TABS tablet Take 1 tablet by mouth every evening.    [provider]  Multiple Vitamins-Minerals (PRESERVISION AREDS 2+MULTI VIT PO) Take 1 capsule by mouth daily.    [provider]  Omega-3 1000 MG CAPS Take 1,000 mg by mouth daily.    [provider]  omeprazole (PRILOSEC) 40 MG capsule Take 40 mg by mouth daily.    [provider]  ondansetron (ZOFRAN ODT) 4 MG disintegrating tablet Take 1 tablet (4 mg total) by mouth every 8 (eight) hours as needed for nausea or vomiting. 02/26/21   Jerrell Belfast, MD  Polyethyl Glycol-Propyl Glycol (SYSTANE OP) Apply 1 drop to eye every morning.    [provider]      Allergies    Ciprofloxacin and Doxycycline    Review of Systems   Review of Systems  All other systems reviewed and are negative.  Physical Exam Updated Vital Signs BP 130/75    Pulse 79    Temp 98.1 F (36.7 C) (Oral)    Resp 19    Ht 5' (1.524 m)    Wt 59.9 kg    SpO2 92%    BMI 25.78 kg/m  Physical Exam Vitals and nursing note reviewed.  Constitutional:      Appearance: Normal appearance.  HENT:     Head: Normocephalic.     Nose:     Comments: There is a small laceration to the bridge of the nose, but no obvious deformity.  There is mild swelling to the bridge of the nose.  There is dried blood in the left nares, however no active bleeding.  There is no septal hematoma and septum appears midline. Eyes:     Extraocular Movements: Extraocular movements intact.     Pupils: Pupils are equal, round, and reactive to light.  Pulmonary:     Effort: Pulmonary effort is normal.  Skin:    General: Skin is warm and dry.  Neurological:     General: No focal deficit present.     Mental Status: She is alert and oriented to person, place, and time.     Cranial Nerves: No cranial nerve  deficit.    ED Results / Procedures / Treatments   Labs (all labs ordered are listed, but only abnormal results are displayed) Labs Reviewed - No data to display  EKG None  Radiology No results found.  Procedures Procedures    Medications Ordered in ED Medications - No data to display  ED Course/ Medical Decision Making/ A&P  Patient presenting here with facial injuries after tripping over a suitcase at home and falling on the ground.  Her x-rays show a nondisplaced nasal bone fracture.  The nose appears midline with no septal hematoma or active bleeding.  I feel as though patient can safely be discharged with ice and ibuprofen/Tylenol.  Final Clinical Impression(s) / ED Diagnoses Final diagnoses:  None    Rx / DC Orders ED Discharge Orders     None         Veryl Speak, MD 12/15/21 [phone]

## 2021-12-19 DIAGNOSIS — J342 Deviated nasal septum: Secondary | ICD-10-CM | POA: Diagnosis not present

## 2021-12-19 DIAGNOSIS — S022XXA Fracture of nasal bones, initial encounter for closed fracture: Secondary | ICD-10-CM | POA: Diagnosis not present

## 2021-12-19 DIAGNOSIS — M950 Acquired deformity of nose: Secondary | ICD-10-CM | POA: Diagnosis not present

## 2021-12-19 DIAGNOSIS — J3489 Other specified disorders of nose and nasal sinuses: Secondary | ICD-10-CM | POA: Diagnosis not present

## 2021-12-25 DIAGNOSIS — J0111 Acute recurrent frontal sinusitis: Secondary | ICD-10-CM | POA: Diagnosis not present

## 2021-12-30 DIAGNOSIS — G4719 Other hypersomnia: Secondary | ICD-10-CM | POA: Diagnosis not present

## 2021-12-30 DIAGNOSIS — H35373 Puckering of macula, bilateral: Secondary | ICD-10-CM | POA: Diagnosis not present

## 2021-12-30 DIAGNOSIS — H43811 Vitreous degeneration, right eye: Secondary | ICD-10-CM | POA: Diagnosis not present

## 2021-12-30 DIAGNOSIS — H35433 Paving stone degeneration of retina, bilateral: Secondary | ICD-10-CM | POA: Diagnosis not present

## 2021-12-30 DIAGNOSIS — H31093 Other chorioretinal scars, bilateral: Secondary | ICD-10-CM | POA: Diagnosis not present

## 2022-02-25 DIAGNOSIS — H35373 Puckering of macula, bilateral: Secondary | ICD-10-CM | POA: Diagnosis not present

## 2022-02-25 DIAGNOSIS — H35433 Paving stone degeneration of retina, bilateral: Secondary | ICD-10-CM | POA: Diagnosis not present

## 2022-02-25 DIAGNOSIS — H43811 Vitreous degeneration, right eye: Secondary | ICD-10-CM | POA: Diagnosis not present

## 2022-02-25 DIAGNOSIS — H43391 Other vitreous opacities, right eye: Secondary | ICD-10-CM | POA: Diagnosis not present

## 2022-05-13 DIAGNOSIS — L570 Actinic keratosis: Secondary | ICD-10-CM | POA: Diagnosis not present

## 2022-05-13 DIAGNOSIS — Z85828 Personal history of other malignant neoplasm of skin: Secondary | ICD-10-CM | POA: Diagnosis not present

## 2022-05-13 DIAGNOSIS — B353 Tinea pedis: Secondary | ICD-10-CM | POA: Diagnosis not present

## 2022-05-13 DIAGNOSIS — L821 Other seborrheic keratosis: Secondary | ICD-10-CM | POA: Diagnosis not present

## 2022-05-18 DIAGNOSIS — R030 Elevated blood-pressure reading, without diagnosis of hypertension: Secondary | ICD-10-CM | POA: Diagnosis not present

## 2022-05-18 DIAGNOSIS — F419 Anxiety disorder, unspecified: Secondary | ICD-10-CM | POA: Diagnosis not present

## 2022-05-18 DIAGNOSIS — G4733 Obstructive sleep apnea (adult) (pediatric): Secondary | ICD-10-CM | POA: Diagnosis not present

## 2022-05-21 DIAGNOSIS — J3489 Other specified disorders of nose and nasal sinuses: Secondary | ICD-10-CM | POA: Diagnosis not present

## 2022-05-21 DIAGNOSIS — M950 Acquired deformity of nose: Secondary | ICD-10-CM | POA: Diagnosis not present

## 2022-05-21 DIAGNOSIS — J342 Deviated nasal septum: Secondary | ICD-10-CM | POA: Diagnosis not present

## 2022-06-09 DIAGNOSIS — G4733 Obstructive sleep apnea (adult) (pediatric): Secondary | ICD-10-CM | POA: Diagnosis not present

## 2022-06-20 DIAGNOSIS — R519 Headache, unspecified: Secondary | ICD-10-CM | POA: Diagnosis not present

## 2022-07-10 DIAGNOSIS — G4733 Obstructive sleep apnea (adult) (pediatric): Secondary | ICD-10-CM | POA: Diagnosis not present

## 2022-07-21 DIAGNOSIS — R030 Elevated blood-pressure reading, without diagnosis of hypertension: Secondary | ICD-10-CM | POA: Diagnosis not present

## 2022-07-21 DIAGNOSIS — Z23 Encounter for immunization: Secondary | ICD-10-CM | POA: Diagnosis not present

## 2022-07-21 DIAGNOSIS — R221 Localized swelling, mass and lump, neck: Secondary | ICD-10-CM | POA: Diagnosis not present

## 2022-08-09 DIAGNOSIS — G4733 Obstructive sleep apnea (adult) (pediatric): Secondary | ICD-10-CM | POA: Diagnosis not present

## 2022-09-03 DIAGNOSIS — F419 Anxiety disorder, unspecified: Secondary | ICD-10-CM | POA: Diagnosis not present

## 2022-09-03 DIAGNOSIS — R030 Elevated blood-pressure reading, without diagnosis of hypertension: Secondary | ICD-10-CM | POA: Diagnosis not present

## 2022-09-03 DIAGNOSIS — G4733 Obstructive sleep apnea (adult) (pediatric): Secondary | ICD-10-CM | POA: Diagnosis not present

## 2022-09-21 DIAGNOSIS — H59813 Chorioretinal scars after surgery for detachment, bilateral: Secondary | ICD-10-CM | POA: Diagnosis not present

## 2022-09-21 DIAGNOSIS — H35033 Hypertensive retinopathy, bilateral: Secondary | ICD-10-CM | POA: Diagnosis not present

## 2022-09-21 DIAGNOSIS — H43811 Vitreous degeneration, right eye: Secondary | ICD-10-CM | POA: Diagnosis not present

## 2022-09-21 DIAGNOSIS — H35373 Puckering of macula, bilateral: Secondary | ICD-10-CM | POA: Diagnosis not present

## 2022-09-29 DIAGNOSIS — H52203 Unspecified astigmatism, bilateral: Secondary | ICD-10-CM | POA: Diagnosis not present

## 2022-09-29 DIAGNOSIS — H524 Presbyopia: Secondary | ICD-10-CM | POA: Diagnosis not present

## 2022-09-29 DIAGNOSIS — H02055 Trichiasis without entropian left lower eyelid: Secondary | ICD-10-CM | POA: Diagnosis not present

## 2022-09-29 DIAGNOSIS — H26493 Other secondary cataract, bilateral: Secondary | ICD-10-CM | POA: Diagnosis not present

## 2022-09-29 DIAGNOSIS — H31003 Unspecified chorioretinal scars, bilateral: Secondary | ICD-10-CM | POA: Diagnosis not present

## 2022-09-29 DIAGNOSIS — H35373 Puckering of macula, bilateral: Secondary | ICD-10-CM | POA: Diagnosis not present

## 2022-10-01 DIAGNOSIS — R030 Elevated blood-pressure reading, without diagnosis of hypertension: Secondary | ICD-10-CM | POA: Diagnosis not present

## 2022-10-01 DIAGNOSIS — G4733 Obstructive sleep apnea (adult) (pediatric): Secondary | ICD-10-CM | POA: Diagnosis not present

## 2022-10-01 DIAGNOSIS — F419 Anxiety disorder, unspecified: Secondary | ICD-10-CM | POA: Diagnosis not present

## 2022-10-28 ENCOUNTER — Other Ambulatory Visit: Payer: Self-pay | Admitting: Internal Medicine

## 2022-10-28 DIAGNOSIS — Z1231 Encounter for screening mammogram for malignant neoplasm of breast: Secondary | ICD-10-CM

## 2022-11-03 DIAGNOSIS — H43813 Vitreous degeneration, bilateral: Secondary | ICD-10-CM | POA: Diagnosis not present

## 2022-11-03 DIAGNOSIS — H35373 Puckering of macula, bilateral: Secondary | ICD-10-CM | POA: Diagnosis not present

## 2022-11-03 DIAGNOSIS — H59813 Chorioretinal scars after surgery for detachment, bilateral: Secondary | ICD-10-CM | POA: Diagnosis not present

## 2022-11-05 DIAGNOSIS — R030 Elevated blood-pressure reading, without diagnosis of hypertension: Secondary | ICD-10-CM | POA: Diagnosis not present

## 2022-11-05 DIAGNOSIS — F419 Anxiety disorder, unspecified: Secondary | ICD-10-CM | POA: Diagnosis not present

## 2022-11-05 DIAGNOSIS — G4733 Obstructive sleep apnea (adult) (pediatric): Secondary | ICD-10-CM | POA: Diagnosis not present

## 2022-11-10 DIAGNOSIS — H26492 Other secondary cataract, left eye: Secondary | ICD-10-CM | POA: Diagnosis not present

## 2022-12-01 DIAGNOSIS — L570 Actinic keratosis: Secondary | ICD-10-CM | POA: Diagnosis not present

## 2022-12-01 DIAGNOSIS — L438 Other lichen planus: Secondary | ICD-10-CM | POA: Diagnosis not present

## 2022-12-01 DIAGNOSIS — L821 Other seborrheic keratosis: Secondary | ICD-10-CM | POA: Diagnosis not present

## 2022-12-01 DIAGNOSIS — Z85828 Personal history of other malignant neoplasm of skin: Secondary | ICD-10-CM | POA: Diagnosis not present

## 2022-12-08 DIAGNOSIS — H26491 Other secondary cataract, right eye: Secondary | ICD-10-CM | POA: Diagnosis not present

## 2022-12-18 ENCOUNTER — Ambulatory Visit: Payer: BC Managed Care – PPO

## 2023-01-11 DIAGNOSIS — F419 Anxiety disorder, unspecified: Secondary | ICD-10-CM | POA: Diagnosis not present

## 2023-01-11 DIAGNOSIS — Z79899 Other long term (current) drug therapy: Secondary | ICD-10-CM | POA: Diagnosis not present

## 2023-01-11 DIAGNOSIS — R030 Elevated blood-pressure reading, without diagnosis of hypertension: Secondary | ICD-10-CM | POA: Diagnosis not present

## 2023-01-11 DIAGNOSIS — K2100 Gastro-esophageal reflux disease with esophagitis, without bleeding: Secondary | ICD-10-CM | POA: Diagnosis not present

## 2023-01-11 DIAGNOSIS — E7800 Pure hypercholesterolemia, unspecified: Secondary | ICD-10-CM | POA: Diagnosis not present

## 2023-01-11 DIAGNOSIS — Z0000 Encounter for general adult medical examination without abnormal findings: Secondary | ICD-10-CM | POA: Diagnosis not present

## 2023-01-11 DIAGNOSIS — E559 Vitamin D deficiency, unspecified: Secondary | ICD-10-CM | POA: Diagnosis not present

## 2023-01-22 IMAGING — CT CT HEAD W/O CM
4 series · 17 of 47 positions shown, 19 images · non-contrast
Comparison: None.

CLINICAL DATA: Minor head trauma.

EXAM:
CT HEAD WITHOUT CONTRAST
TECHNIQUE: Contiguous axial images were obtained from the base of the skull
through the vertex without intravenous contrast.

[Series 2: head wo · axial · 0.42mm/px · z∈[-74,+50]mm · 7 of 35 slices shown, 9 images]
[im 5/35  brain]
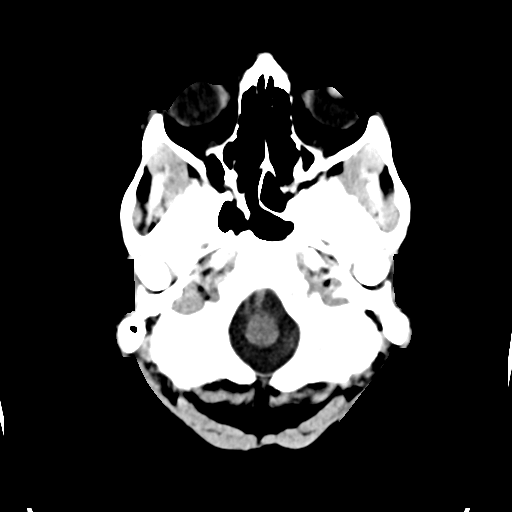
[im 5/35  bone]
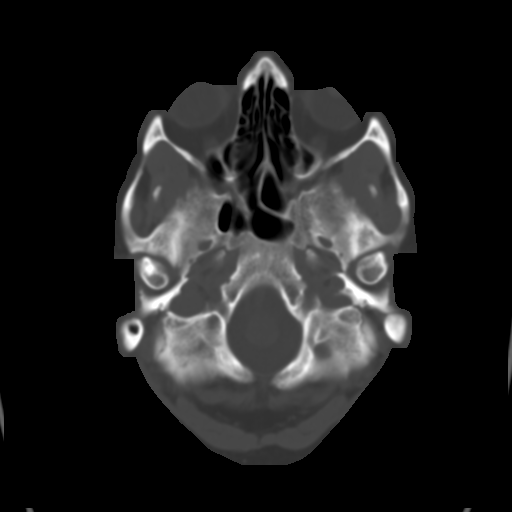
[im 9/35  brain]
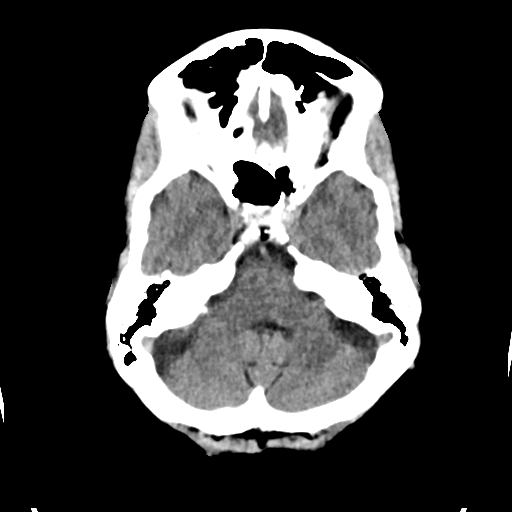
[im 13/35  brain]
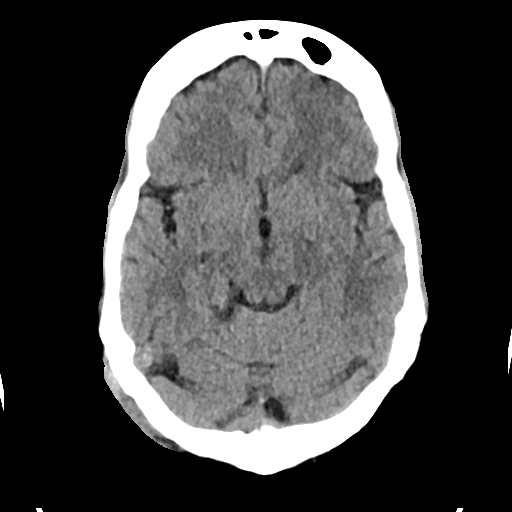
[im 18/35  brain]
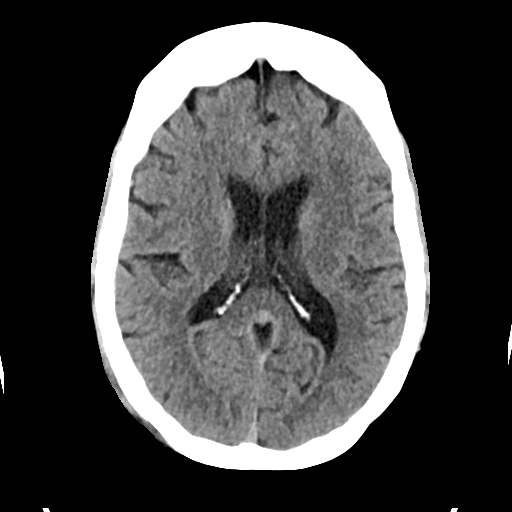
[im 22/35  brain]
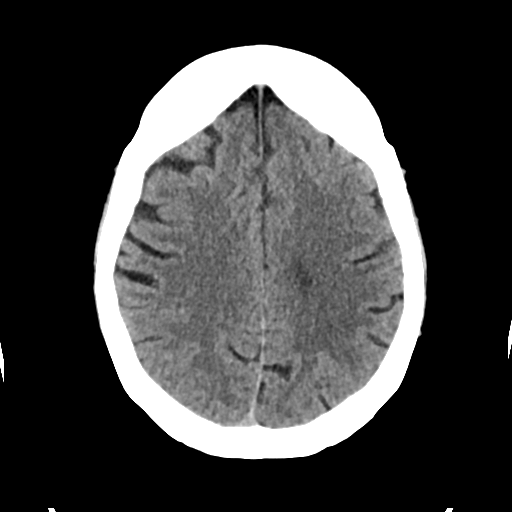
[im 22/35  bone]
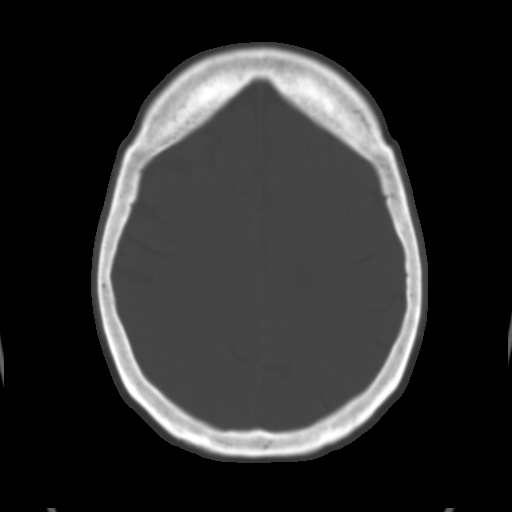
[im 26/35  brain]
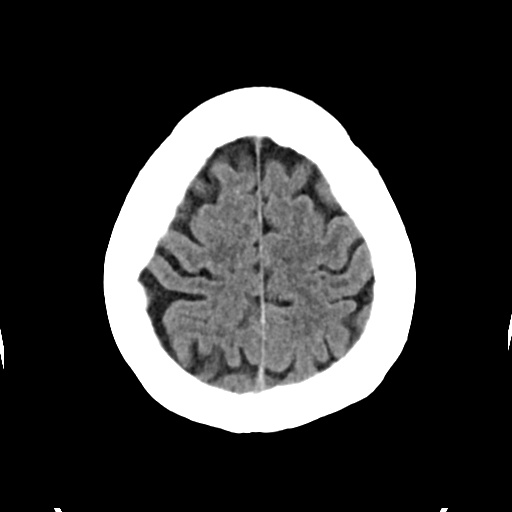
[im 30/35  brain]
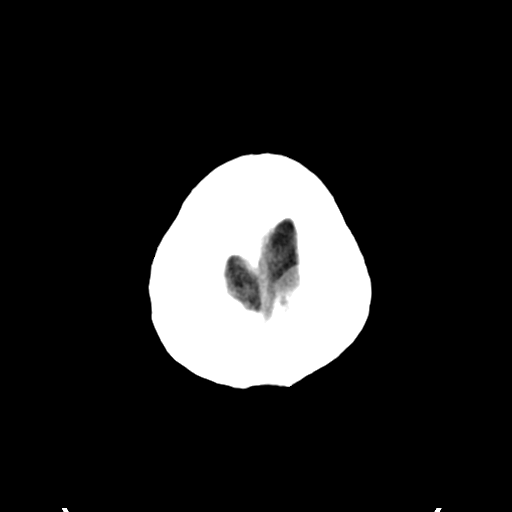

[Series 3: head bone · axial · 0.42mm/px · z∈[-78,-18]mm · 4 of 87 slices shown]
[im 9/87  bone]
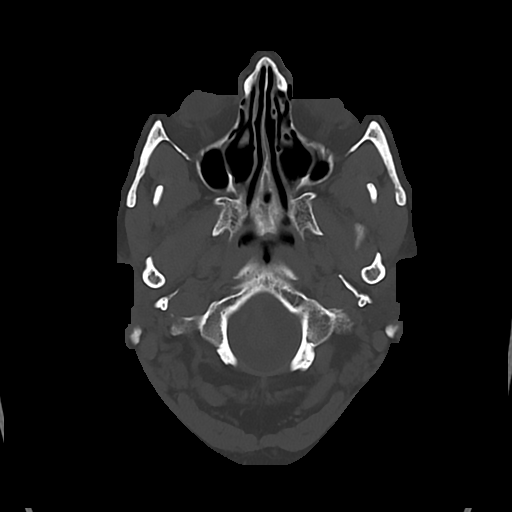
[im 18/87  bone]
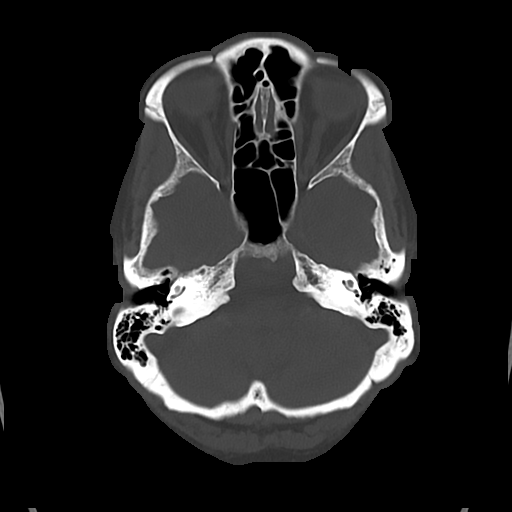
[im 26/87  bone]
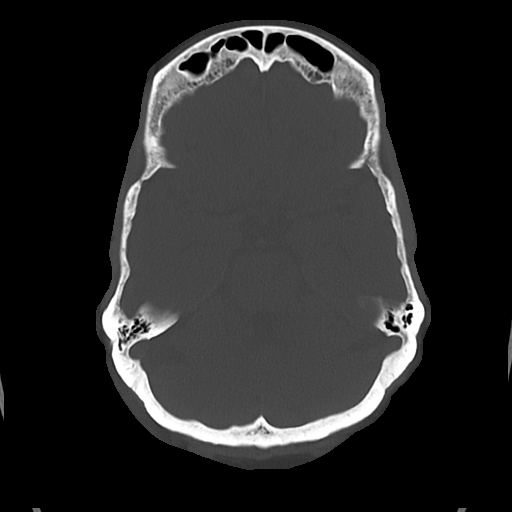
[im 39/87  bone]
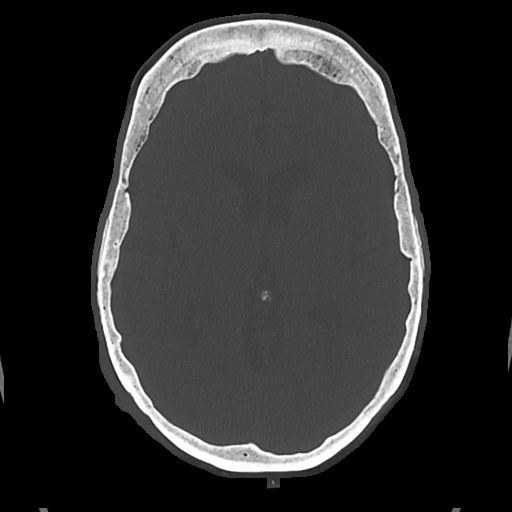

[Series 4: cor soft · coronal · 0.34mm/px · 3 of 66 slices shown]
[im 22/66  brain]
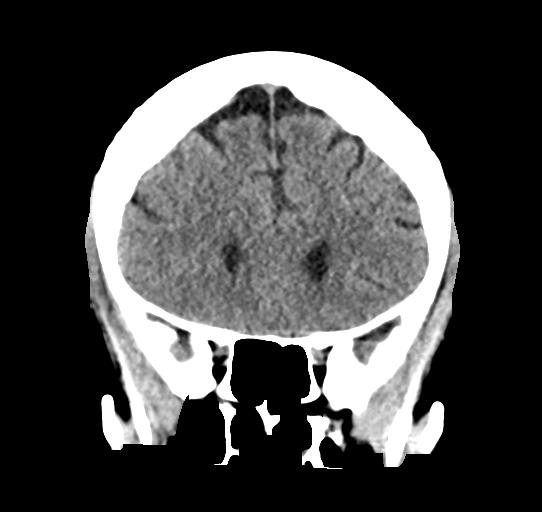
[im 29/66  brain]
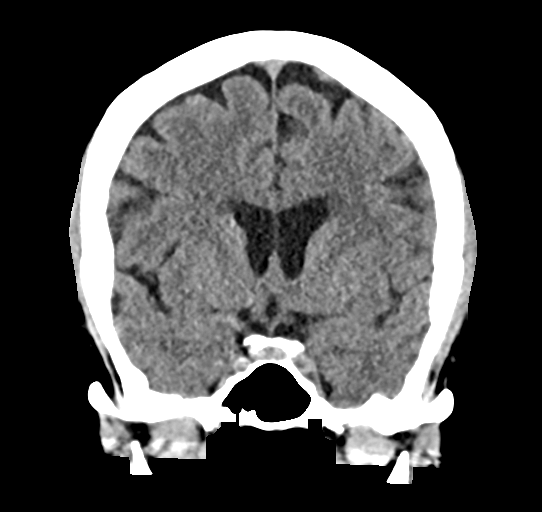
[im 37/66  brain]
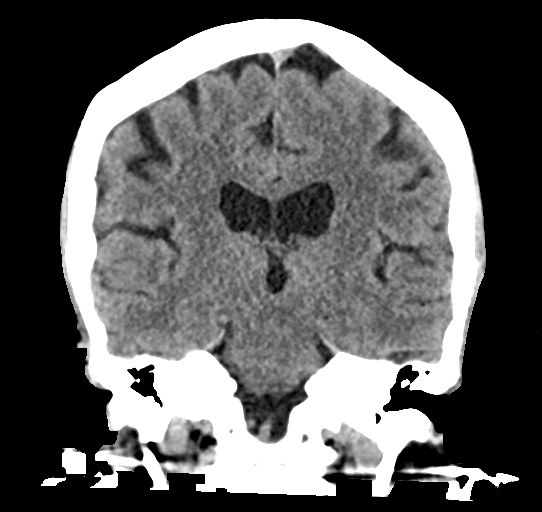

[Series 5: sag soft · sagittal · 0.34mm/px · 3 of 60 slices shown]
[im 20/60  brain]
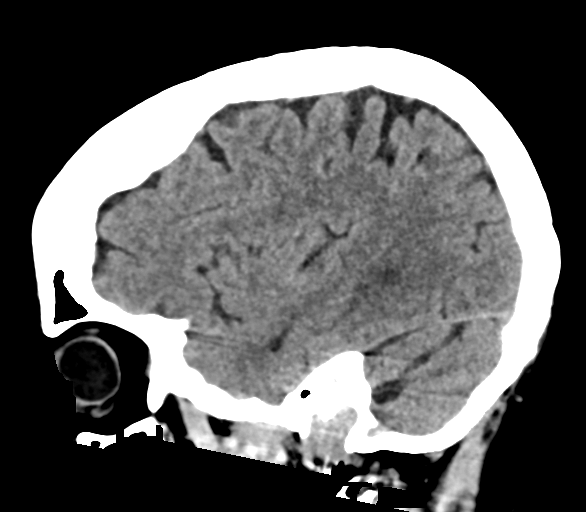
[im 30/60  brain]
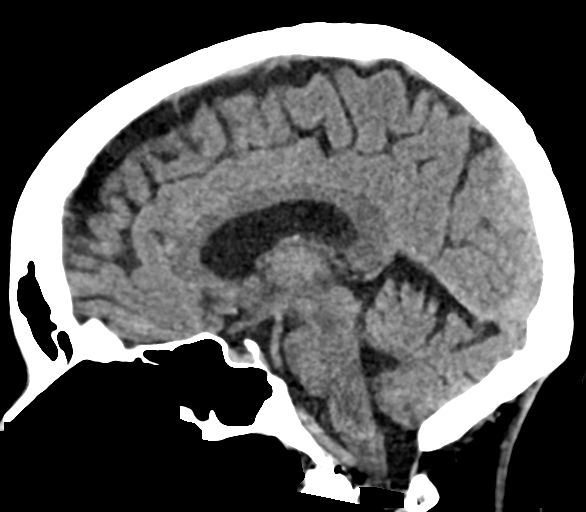
[im 40/60  brain]
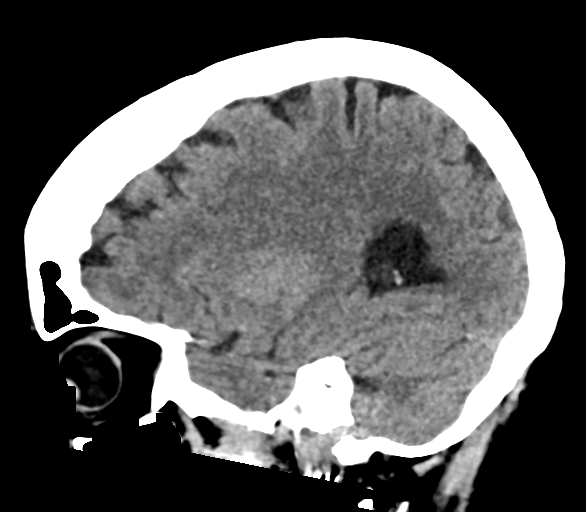

[17 of 47 positions shown; findings below may reference images not displayed]

FINDINGS: Brain:

No evidence of large-territorial acute infarction. No parenchymal
hemorrhage. No mass lesion. No extra-axial collection.

No mass effect or midline shift. No hydrocephalus. Basilar cisterns
are patent.

Vascular: No hyperdense vessel. Atherosclerotic calcifications are
present within the cavernous internal carotid arteries.

Skull: No acute fracture or focal lesion.

Sinuses/Orbits: Paranasal sinuses and mastoid air cells are clear.
The orbits are unremarkable.

Other: Right lower occipital scalp hematoma measuring up to 13 mm.
IMPRESSION: 1. No acute intracranial abnormality.
2. Right lower occipital scalp 13 mm hematoma.

## 2023-02-04 ENCOUNTER — Other Ambulatory Visit: Payer: Self-pay | Admitting: Internal Medicine

## 2023-02-04 DIAGNOSIS — M8589 Other specified disorders of bone density and structure, multiple sites: Secondary | ICD-10-CM

## 2023-02-25 DIAGNOSIS — R051 Acute cough: Secondary | ICD-10-CM | POA: Diagnosis not present

## 2023-02-25 DIAGNOSIS — J329 Chronic sinusitis, unspecified: Secondary | ICD-10-CM | POA: Diagnosis not present

## 2023-03-03 ENCOUNTER — Ambulatory Visit
Admission: RE | Admit: 2023-03-03 | Discharge: 2023-03-03 | Disposition: A | Discharge status: Home / Self Care | Payer: BC Managed Care – PPO | Source: Ambulatory Visit | Attending: Internal Medicine | Admitting: Internal Medicine

## 2023-03-03 DIAGNOSIS — R1311 Dysphagia, oral phase: Secondary | ICD-10-CM | POA: Diagnosis not present

## 2023-03-03 DIAGNOSIS — Z1231 Encounter for screening mammogram for malignant neoplasm of breast: Secondary | ICD-10-CM | POA: Diagnosis not present

## 2023-03-09 DIAGNOSIS — K222 Esophageal obstruction: Secondary | ICD-10-CM | POA: Diagnosis not present

## 2023-03-09 DIAGNOSIS — R1310 Dysphagia, unspecified: Secondary | ICD-10-CM | POA: Diagnosis not present

## 2023-03-09 DIAGNOSIS — K317 Polyp of stomach and duodenum: Secondary | ICD-10-CM | POA: Diagnosis not present

## 2023-03-09 DIAGNOSIS — K449 Diaphragmatic hernia without obstruction or gangrene: Secondary | ICD-10-CM | POA: Diagnosis not present

## 2023-03-22 DIAGNOSIS — J342 Deviated nasal septum: Secondary | ICD-10-CM | POA: Diagnosis not present

## 2023-03-22 DIAGNOSIS — R221 Localized swelling, mass and lump, neck: Secondary | ICD-10-CM | POA: Diagnosis not present

## 2023-05-18 DIAGNOSIS — M950 Acquired deformity of nose: Secondary | ICD-10-CM | POA: Diagnosis not present

## 2023-05-18 DIAGNOSIS — J3489 Other specified disorders of nose and nasal sinuses: Secondary | ICD-10-CM | POA: Diagnosis not present

## 2023-06-02 DIAGNOSIS — H59813 Chorioretinal scars after surgery for detachment, bilateral: Secondary | ICD-10-CM | POA: Diagnosis not present

## 2023-06-02 DIAGNOSIS — H35373 Puckering of macula, bilateral: Secondary | ICD-10-CM | POA: Diagnosis not present

## 2023-06-02 DIAGNOSIS — H43813 Vitreous degeneration, bilateral: Secondary | ICD-10-CM | POA: Diagnosis not present

## 2023-07-23 DIAGNOSIS — Z23 Encounter for immunization: Secondary | ICD-10-CM | POA: Diagnosis not present

## 2023-08-10 DIAGNOSIS — L570 Actinic keratosis: Secondary | ICD-10-CM | POA: Diagnosis not present

## 2023-08-10 DIAGNOSIS — L820 Inflamed seborrheic keratosis: Secondary | ICD-10-CM | POA: Diagnosis not present

## 2023-08-25 ENCOUNTER — Ambulatory Visit
Admission: RE | Admit: 2023-08-25 | Discharge: 2023-08-25 | Disposition: A | Discharge status: Home / Self Care | Payer: BC Managed Care – PPO | Source: Ambulatory Visit | Attending: Internal Medicine | Admitting: Internal Medicine

## 2023-08-25 ENCOUNTER — Other Ambulatory Visit: Payer: BC Managed Care – PPO

## 2023-08-25 DIAGNOSIS — M8588 Other specified disorders of bone density and structure, other site: Secondary | ICD-10-CM | POA: Diagnosis not present

## 2023-08-25 DIAGNOSIS — N958 Other specified menopausal and perimenopausal disorders: Secondary | ICD-10-CM | POA: Diagnosis not present

## 2023-08-25 DIAGNOSIS — E2839 Other primary ovarian failure: Secondary | ICD-10-CM | POA: Diagnosis not present

## 2023-08-25 DIAGNOSIS — M8589 Other specified disorders of bone density and structure, multiple sites: Secondary | ICD-10-CM

## 2023-09-23 DIAGNOSIS — H02055 Trichiasis without entropian left lower eyelid: Secondary | ICD-10-CM | POA: Diagnosis not present

## 2023-11-04 DIAGNOSIS — B078 Other viral warts: Secondary | ICD-10-CM | POA: Diagnosis not present

## 2023-11-04 DIAGNOSIS — L570 Actinic keratosis: Secondary | ICD-10-CM | POA: Diagnosis not present

## 2023-12-07 DIAGNOSIS — H43813 Vitreous degeneration, bilateral: Secondary | ICD-10-CM | POA: Diagnosis not present

## 2023-12-07 DIAGNOSIS — H35373 Puckering of macula, bilateral: Secondary | ICD-10-CM | POA: Diagnosis not present

## 2023-12-07 DIAGNOSIS — H59813 Chorioretinal scars after surgery for detachment, bilateral: Secondary | ICD-10-CM | POA: Diagnosis not present

## 2023-12-29 DIAGNOSIS — L570 Actinic keratosis: Secondary | ICD-10-CM | POA: Diagnosis not present

## 2023-12-29 DIAGNOSIS — L821 Other seborrheic keratosis: Secondary | ICD-10-CM | POA: Diagnosis not present

## 2023-12-29 DIAGNOSIS — Z85828 Personal history of other malignant neoplasm of skin: Secondary | ICD-10-CM | POA: Diagnosis not present

## 2023-12-29 DIAGNOSIS — L308 Other specified dermatitis: Secondary | ICD-10-CM | POA: Diagnosis not present

## 2024-01-24 DIAGNOSIS — R030 Elevated blood-pressure reading, without diagnosis of hypertension: Secondary | ICD-10-CM | POA: Diagnosis not present

## 2024-01-24 DIAGNOSIS — Z0001 Encounter for general adult medical examination with abnormal findings: Secondary | ICD-10-CM | POA: Diagnosis not present

## 2024-01-24 DIAGNOSIS — E7800 Pure hypercholesterolemia, unspecified: Secondary | ICD-10-CM | POA: Diagnosis not present

## 2024-01-24 DIAGNOSIS — Z79899 Other long term (current) drug therapy: Secondary | ICD-10-CM | POA: Diagnosis not present

## 2024-01-24 DIAGNOSIS — K2100 Gastro-esophageal reflux disease with esophagitis, without bleeding: Secondary | ICD-10-CM | POA: Diagnosis not present

## 2024-01-24 DIAGNOSIS — F419 Anxiety disorder, unspecified: Secondary | ICD-10-CM | POA: Diagnosis not present

## 2024-01-24 DIAGNOSIS — E559 Vitamin D deficiency, unspecified: Secondary | ICD-10-CM | POA: Diagnosis not present

## 2024-03-16 DIAGNOSIS — H02054 Trichiasis without entropian left upper eyelid: Secondary | ICD-10-CM | POA: Diagnosis not present

## 2024-03-16 DIAGNOSIS — H04123 Dry eye syndrome of bilateral lacrimal glands: Secondary | ICD-10-CM | POA: Diagnosis not present

## 2024-03-16 DIAGNOSIS — H02055 Trichiasis without entropian left lower eyelid: Secondary | ICD-10-CM | POA: Diagnosis not present

## 2024-03-23 DIAGNOSIS — R030 Elevated blood-pressure reading, without diagnosis of hypertension: Secondary | ICD-10-CM | POA: Diagnosis not present

## 2024-03-23 DIAGNOSIS — J0101 Acute recurrent maxillary sinusitis: Secondary | ICD-10-CM | POA: Diagnosis not present

## 2024-05-16 DIAGNOSIS — H02054 Trichiasis without entropian left upper eyelid: Secondary | ICD-10-CM | POA: Diagnosis not present

## 2024-05-16 DIAGNOSIS — Z961 Presence of intraocular lens: Secondary | ICD-10-CM | POA: Diagnosis not present

## 2024-05-16 DIAGNOSIS — H35373 Puckering of macula, bilateral: Secondary | ICD-10-CM | POA: Diagnosis not present

## 2024-05-18 DIAGNOSIS — R0981 Nasal congestion: Secondary | ICD-10-CM | POA: Diagnosis not present

## 2024-05-23 DIAGNOSIS — Z85828 Personal history of other malignant neoplasm of skin: Secondary | ICD-10-CM | POA: Diagnosis not present

## 2024-05-23 DIAGNOSIS — L858 Other specified epidermal thickening: Secondary | ICD-10-CM | POA: Diagnosis not present

## 2024-05-23 DIAGNOSIS — L821 Other seborrheic keratosis: Secondary | ICD-10-CM | POA: Diagnosis not present

## 2024-05-23 DIAGNOSIS — L218 Other seborrheic dermatitis: Secondary | ICD-10-CM | POA: Diagnosis not present

## 2024-06-05 DIAGNOSIS — H35433 Paving stone degeneration of retina, bilateral: Secondary | ICD-10-CM | POA: Diagnosis not present

## 2024-06-05 DIAGNOSIS — H35373 Puckering of macula, bilateral: Secondary | ICD-10-CM | POA: Diagnosis not present

## 2024-06-05 DIAGNOSIS — H59813 Chorioretinal scars after surgery for detachment, bilateral: Secondary | ICD-10-CM | POA: Diagnosis not present

## 2024-06-05 DIAGNOSIS — H43813 Vitreous degeneration, bilateral: Secondary | ICD-10-CM | POA: Diagnosis not present

## 2024-06-14 DIAGNOSIS — Z7189 Other specified counseling: Secondary | ICD-10-CM | POA: Diagnosis not present

## 2024-06-14 DIAGNOSIS — S0990XA Unspecified injury of head, initial encounter: Secondary | ICD-10-CM | POA: Diagnosis not present

## 2024-07-12 DIAGNOSIS — I10 Essential (primary) hypertension: Secondary | ICD-10-CM | POA: Diagnosis not present

## 2024-08-02 DIAGNOSIS — R030 Elevated blood-pressure reading, without diagnosis of hypertension: Secondary | ICD-10-CM | POA: Diagnosis not present

## 2024-08-02 DIAGNOSIS — G8929 Other chronic pain: Secondary | ICD-10-CM | POA: Diagnosis not present

## 2024-08-02 DIAGNOSIS — F419 Anxiety disorder, unspecified: Secondary | ICD-10-CM | POA: Diagnosis not present

## 2024-09-12 ENCOUNTER — Emergency Department (HOSPITAL_COMMUNITY): Admission: EM | Admit: 2024-09-12 | Discharge: 2024-09-12 | Disposition: A | Discharge status: Home / Self Care

## 2024-09-12 ENCOUNTER — Other Ambulatory Visit: Payer: Self-pay

## 2024-09-12 ENCOUNTER — Emergency Department (HOSPITAL_COMMUNITY)

## 2024-09-12 ENCOUNTER — Encounter (HOSPITAL_COMMUNITY): Payer: Self-pay

## 2024-09-12 DIAGNOSIS — S0990XA Unspecified injury of head, initial encounter: Secondary | ICD-10-CM | POA: Diagnosis not present

## 2024-09-12 DIAGNOSIS — R930 Abnormal findings on diagnostic imaging of skull and head, not elsewhere classified: Secondary | ICD-10-CM | POA: Insufficient documentation

## 2024-09-12 DIAGNOSIS — S0511XA Contusion of eyeball and orbital tissues, right eye, initial encounter: Secondary | ICD-10-CM | POA: Diagnosis not present

## 2024-09-12 DIAGNOSIS — Y92019 Unspecified place in single-family (private) house as the place of occurrence of the external cause: Secondary | ICD-10-CM | POA: Diagnosis not present

## 2024-09-12 DIAGNOSIS — Z79899 Other long term (current) drug therapy: Secondary | ICD-10-CM | POA: Diagnosis not present

## 2024-09-12 DIAGNOSIS — S0993XA Unspecified injury of face, initial encounter: Secondary | ICD-10-CM | POA: Diagnosis not present

## 2024-09-12 DIAGNOSIS — W2209XA Striking against other stationary object, initial encounter: Secondary | ICD-10-CM | POA: Diagnosis not present

## 2024-09-12 DIAGNOSIS — S0591XA Unspecified injury of right eye and orbit, initial encounter: Secondary | ICD-10-CM | POA: Diagnosis not present

## 2024-09-12 LAB — URINALYSIS, ROUTINE W REFLEX MICROSCOPIC
Bacteria, UA: NONE SEEN
Bilirubin Urine: NEGATIVE
Glucose, UA: NEGATIVE mg/dL
Hgb urine dipstick: NEGATIVE
Ketones, ur: NEGATIVE mg/dL
Leukocytes,Ua: NEGATIVE
Nitrite: NEGATIVE
Protein, ur: 100 mg/dL — AB
Specific Gravity, Urine: 1.014 (ref 1.005–1.030)
pH: 7 (ref 5.0–8.0)

## 2024-09-12 LAB — BASIC METABOLIC PANEL WITH GFR
Anion gap: 8 (ref 5–15)
BUN: 18 mg/dL (ref 8–23)
CO2: 27 mmol/L (ref 22–32)
Calcium: 9.6 mg/dL (ref 8.9–10.3)
Chloride: 98 mmol/L (ref 98–111)
Creatinine, Ser: 0.58 mg/dL (ref 0.44–1.00)
GFR, Estimated: >60 mL/min (ref >60)
Glucose, Bld: 116 mg/dL — ABNORMAL HIGH (ref 70–99)
Potassium: 4.7 mmol/L (ref 3.5–5.1)
Sodium: 133 mmol/L — ABNORMAL LOW (ref 135–145)

## 2024-09-12 NOTE — ED Provider Notes (Signed)
 [place] EMERGENCY DEPARTMENT AT [facility] Provider Note   CSN: [id number] Arrival date & time: 09/12/24  1248     Patient presents with: No chief complaint on file.   Claudia Robinson is a 73 y.o. female.   The history is provided by the patient and medical records. No language interpreter was used.     73 year old female significant history of GERD, Schatzki's ring, anxiety, anemia, presenting for evaluation of fall.  Patient reported yesterday she tripped and fell striking her face against a bookcase in her house.  Impact was directly to her right side of face near the eye..  Since then she noticed quite a bit of bruising and swelling to the affected area.  She does not endorse any significant eye pain, vision changes, or pain with eye movement.  She denies any confusion, or significant headache.  She did report having an episode of vomiting earlier today when she was coughing hard while eating as sometimes her food will get stuck in her esophagus due to her history of Schatzki ring.  She does not endorse any projectile vomiting, focal numbness or focal weakness or confusion.  She is not on any blood thinner medication.    Prior to Admission medications   Medication Sig Start Date End Date Taking? Authorizing Provider  atorvastatin  (LIPITOR) 10 MG tablet Take 10 mg by mouth every Monday, Wednesday, and Friday.    [provider]  Calcium  Carb-Cholecalciferol (CALCIUM -VITAMIN D) 600-400 MG-UNIT TABS Take 1 tablet by mouth daily.    [provider]  Cholecalciferol 50 MCG (2000 UT) CAPS Take 2,000 Units by mouth every evening.    [provider]  Coenzyme Q10 (COQ10) 200 MG CAPS Take 200 mg by mouth every evening.    [provider]  COVID-19 mRNA bivalent vaccine, Pfizer, (PFIZER COVID-19 VAC BIVALENT) injection Inject into the muscle. 07/16/21   Luiz Channel, MD  COVID-19 mRNA Vac-TriS, Pfizer, (PFIZER-BIONT COVID-19 VAC-TRIS) SUSP  injection Inject into the muscle. 03/20/21   Luiz Channel, MD  EFFEXOR XR 75 MG 24 hr capsule Take 150 mg by mouth daily with breakfast. 01/16/14   [provider]  famotidine (PEPCID) 20 MG tablet Take 40 mg by mouth daily as needed for heartburn.    [provider]  HYDROcodone -acetaminophen  (NORCO/VICODIN) 5-325 MG tablet 1 - 2 po q 12 hrs prn postop pain, alternate every 6 hours with Motrin  600 mg 02/26/21   Mable Lenis, MD  Multiple Vitamin (MULTIVITAMIN WITH MINERALS) TABS tablet Take 1 tablet by mouth every evening.    [provider]  Multiple Vitamins-Minerals (PRESERVISION AREDS 2+MULTI VIT PO) Take 1 capsule by mouth daily.    [provider]  Omega-3 1000 MG CAPS Take 1,000 mg by mouth daily.    [provider]  omeprazole (PRILOSEC) 40 MG capsule Take 40 mg by mouth daily.    [provider]  ondansetron  (ZOFRAN  ODT) 4 MG disintegrating tablet Take 1 tablet (4 mg total) by mouth every 8 (eight) hours as needed for nausea or vomiting. 02/26/21   Mable Lenis, MD  Polyethyl Glycol-Propyl Glycol (SYSTANE OP) Apply 1 drop to eye every morning.    [provider]    Allergies: Ciprofloxacin and Doxycycline    Review of Systems  All other systems reviewed and are negative.   Updated Vital Signs BP (!) 173/76 (BP Location: Left Arm)   Pulse 85   Temp 97.9 F (36.6 C) (Oral)   Resp 16  SpO2 100%   Physical Exam Vitals and nursing note reviewed.  Constitutional:      General: She is not in acute distress.    Appearance: She is well-developed.  HENT:     Head: Normocephalic.     Comments: No scalp tenderness Eyes:     Extraocular Movements: Extraocular movements intact.     Conjunctiva/sclera: Conjunctivae normal.     Pupils: Pupils are equal, round, and reactive to light.     Comments: Ecchymosis around right orbital region with tenderness to the upper orbital rim but no laceration noted.  No ocular  involvement.  No restriction of eye movement and no vision changes.  Cardiovascular:     Rate and Rhythm: Normal rate and regular rhythm.     Pulses: Normal pulses.     Heart sounds: Normal heart sounds.  Pulmonary:     Effort: Pulmonary effort is normal.  Abdominal:     Palpations: Abdomen is soft.     Tenderness: There is no abdominal tenderness.  Musculoskeletal:        General: Normal range of motion.     Cervical back: Neck supple.  Skin:    Findings: No rash.  Neurological:     General: No focal deficit present.     Mental Status: She is alert and oriented to person, place, and time. Mental status is at baseline.     Comments: Ambulate without difficulty  Psychiatric:        Mood and Affect: Mood normal.     (all labs ordered are listed, but only abnormal results are displayed) Labs Reviewed  BASIC METABOLIC PANEL WITH GFR - Abnormal; Notable for the following components:      Result Value   Sodium 133 (*)    Glucose, Bld 116 (*)    All other components within normal limits  CBC WITH DIFFERENTIAL/PLATELET - Abnormal; Notable for the following components:   RBC 2.84 (*)    HCT 28.4 (*)    MCH 43.3 (*)    MCHC 43.3 (*)    All other components within normal limits  URINALYSIS, ROUTINE W REFLEX MICROSCOPIC - Abnormal; Notable for the following components:   Protein, ur 100 (*)    Non Squamous Epithelial 0-5 (*)    All other components within normal limits    EKG: None  Date: 09/12/2024  Rate: 82  Rhythm: normal sinus rhythm  QRS Axis: normal  Intervals: normal  ST/T Wave abnormalities: normal  Conduction Disutrbances: none  Narrative Interpretation:   Old EKG Reviewed: No significant changes noted    Radiology: CT HEAD WO CONTRAST ( ) Result Date: 09/12/2024 EXAM: CT HEAD WITHOUT CONTRAST 09/12/2024 01:46:00 PM TECHNIQUE: CT of the head was performed without the administration of intravenous contrast. Automated exposure control, iterative  reconstruction, and/or weight based adjustment of the mA/kV was utilized to reduce the radiation dose to as low as reasonably achievable. COMPARISON: CT head 07/12/2021. CLINICAL HISTORY: Orbital trauma. FINDINGS: BRAIN AND VENTRICLES: Mild cerebral volume loss, similar to prior study. There is overall similar mild scattered white matter hypodensities which are nonspecific but most commonly represent chronic microvascular ischemic changes. No acute hemorrhage. No evidence of acute infarct. No hydrocephalus. No extra-axial collection. No mass effect or midline shift. ORBITS: Bilateral lens replacement. There is a 1.2 cm right lateral periorbital hematoma and mild surrounding contusive changes. Soft tissue swelling of the infraorbital malar soft tissues. The globes appear intact. No evidence of a retrobulbar hematoma. SINUSES: Opacified posterior right ethmoid  air cell. SOFT TISSUES AND SKULL: Chronic anterior nasal bone deformity. No acute skull fracture. IMPRESSION: 1. No acute intracranial hemorrhage or calvarial fracture. 2. Right lateral periorbital hematoma with surrounding contusive changes and swelling; no retrobulbar hematoma, globes intact. Electronically signed by: prentice spade 09/12/2024 02:24 PM EST RP Workstation: [id number]     Procedures   Medications Ordered in the ED - No data to display                                  Medical Decision Making Amount and/or Complexity of Data Reviewed Labs: ordered. Radiology: ordered. ECG/medicine tests: ordered.   BP (!) 173/76 (BP Location: Left Arm)   Pulse 85   Temp 97.9 F (36.6 C) (Oral)   Resp 16   SpO2 100%   24:71 PM   73 year old female significant history of GERD, Schatzki's ring, anxiety, anemia, presenting for evaluation of fall.  Patient reported yesterday she tripped and fell striking her face against a bookcase in her house.  Impact was directly to her right side of face near the eye..  Since then she noticed quite a bit  of bruising and swelling to the affected area.  She does not endorse any significant eye pain, vision changes, or pain with eye movement.  She denies any confusion, or significant headache.  She did report having an episode of vomiting earlier today when she was coughing hard while eating as sometimes her food will get stuck in her esophagus due to her history of Schatzki ring.  She does not endorse any projectile vomiting, focal numbness or focal weakness or confusion.  She is not on any blood thinner medication.  Exam notable for ecchymosis involving the right orbits without any laceration.  No significant ocular involvement.  No midface tenderness no malocclusion.  No scalp tenderness patient is mentating appropriately ambulate without difficulty.  -Labs ordered, independently viewed and interpreted by me.  Labs remarkable for labs overall reassuring -The patient was maintained on a cardiac monitor.  I personally viewed and interpreted the cardiac monitored which showed an underlying rhythm of: NSR -Imaging independently viewed and interpreted by me and I agree with radiologist's interpretation.  Result remarkable for head CT scan without any intracranial hemorrhage or fracture.  Patient does have a right lateral periorbital hematoma with signs of contusion but no retrobulbar hematoma and the globe is intact -This patient presents to the ED for concern of facial injury, this involves an extensive number of treatment options, and is a complaint that carries with it a high risk of complications and morbidity.  The differential diagnosis includes fx, dislocation, strain, sprain, contusion, ocular injury -Co morbidities that complicate the patient evaluation includes anemia, GERD, anxiety -Treatment includes reassurance -Reevaluation of the patient after these medicines showed that the patient improved -PCP office notes or outside notes reviewed -Escalation to admission/observation considered: patients  feels much better, is comfortable with discharge, and will follow up with PCP -Prescription medication considered, patient comfortable with ice pack at home, OTC pain as needed -Social Determinant of Health considered   Low suspicion for domestic abuse.       Final diagnoses:  Traumatic ecchymosis of orbit, right, initial encounter    ED Discharge Orders     None          Nivia Colon, PA-C 09/12/24 1727    Ula Prentice SAUNDERS, MD 09/13/24 2328

## 2024-09-12 NOTE — ED Provider Triage Note (Signed)
 Emergency Medicine Provider Triage Evaluation Note  Claudia Robinson , a 73 y.o. female  was evaluated in triage.  Pt complains of facial injury. Pt sts she tripped and fell, striking face against a book casing in her house, no loc. Incident happened yesterday. Today she notice significant bruising, some mild blurry vision.  Did report having a hard cough and vomited up once yesterday but attributed to her Schatzki ring causing food impaction.  No focal numbness, focal weakness.  Not on blood thinner  Review of Systems  Positive: As above Negative: As above  Physical Exam  BP (!) 145/117 (BP Location: Right Arm)   Pulse 89   Temp 97.6 F (36.4 C)   Resp 18   SpO2 98%  Gen:   Awake, no distress   Resp:  Normal effort  MSK:   Moves extremities without difficulty  Other:  R raccoon's eye  Medical Decision Making  Medically screening exam initiated at 1:12 PM.  Appropriate orders placed.  Claudia Robinson was informed that the remainder of the evaluation will be completed by another provider, this initial triage assessment does not replace that evaluation, and the importance of remaining in the ED until their evaluation is complete.     Nivia Colon, PA-C 09/12/24 1314

## 2024-09-12 NOTE — ED Triage Notes (Signed)
 Pt c/o mechanical fall last night; no loc, endorses dizziness prior; hit corner of wall, bruising to R eye; denies thinner use; c/o pain to R face; pt also states she was coughing hard and throw up lunch yesterday; denies nausea after head strike  Pt gives verbal consent for mse

## 2024-09-12 NOTE — ED Notes (Signed)
AVS provided to and discussed with patient and family member at bedside. Pt verbalizes understanding of discharge instructions and denies any questions or concerns at this time. Pt has ride home. Pt ambulated out of department independently with steady gait.  

## 2024-09-13 LAB — CBC WITH DIFFERENTIAL/PLATELET
Abs Immature Granulocytes: 0.02 K/uL (ref 0.00–0.07)
Basophils Absolute: 0.1 K/uL (ref 0.0–0.1)
Basophils Relative: 1 %
Eosinophils Absolute: 0 K/uL (ref 0.0–0.5)
Eosinophils Relative: 0 %
HCT: 37.2 % — AB (ref 36.0–46.0)
Hemoglobin: 12.3 g/dL (ref 12.0–15.0)
Immature Granulocytes: 0 %
Lymphocytes Relative: 25 %
Lymphs Abs: 1.7 K/uL (ref 0.7–4.0)
MCH: 43.3 pg — AB (ref 26.0–34.0)
MCHC: 43.3 g/dL — AB (ref 30.0–36.0)
MCV: 100 fL (ref 80.0–100.0)
Monocytes Absolute: 0.5 K/uL (ref 0.1–1.0)
Monocytes Relative: 8 %
Neutro Abs: 4.5 K/uL (ref 1.7–7.7)
Neutrophils Relative %: 66 %
Platelets: 319 K/uL (ref 150–400)
RBC: 3.91 MIL/uL — AB (ref 3.87–5.11)
RDW: 13.9 % (ref 11.5–15.5)
Smear Review: NORMAL
WBC: 6.8 K/uL (ref 4.0–10.5)
nRBC: 0 % (ref 0.0–0.2)

## 2024-09-20 DIAGNOSIS — S0011XA Contusion of right eyelid and periocular area, initial encounter: Secondary | ICD-10-CM | POA: Diagnosis not present

## 2024-09-20 DIAGNOSIS — H35373 Puckering of macula, bilateral: Secondary | ICD-10-CM | POA: Diagnosis not present
